# Patient Record
Sex: Male | Born: 1965 | Race: White | Hispanic: No | Marital: Married | Smoking: Former smoker
Health system: Southern US, Community
[De-identification: ages and names within clinical notes are randomized; demographics above are authoritative.]

## PROBLEM LIST (undated history)

## (undated) DIAGNOSIS — K219 Gastro-esophageal reflux disease without esophagitis: Secondary | ICD-10-CM

## (undated) HISTORY — PX: OTHER SURGICAL HISTORY: SHX169

## (undated) HISTORY — DX: Gastro-esophageal reflux disease without esophagitis: K21.9

---

## 1988-12-04 HISTORY — PX: HAND SURGERY: SHX662

## 2001-11-29 ENCOUNTER — Encounter: Payer: Self-pay | Admitting: Orthopedic Surgery

## 2001-11-29 ENCOUNTER — Ambulatory Visit (HOSPITAL_COMMUNITY): Admission: RE | Admit: 2001-11-29 | Discharge: 2001-11-29 | Payer: Self-pay | Admitting: Orthopedic Surgery

## 2008-05-02 ENCOUNTER — Ambulatory Visit (HOSPITAL_COMMUNITY): Admission: RE | Admit: 2008-05-02 | Discharge: 2008-05-02 | Payer: Self-pay | Admitting: Specialist

## 2008-05-29 ENCOUNTER — Ambulatory Visit (HOSPITAL_BASED_OUTPATIENT_CLINIC_OR_DEPARTMENT_OTHER): Admission: RE | Admit: 2008-05-29 | Discharge: 2008-05-29 | Payer: Self-pay | Admitting: Specialist

## 2008-06-10 ENCOUNTER — Encounter (HOSPITAL_COMMUNITY): Admission: RE | Admit: 2008-06-10 | Discharge: 2008-07-10 | Payer: Self-pay | Admitting: Specialist

## 2010-04-26 ENCOUNTER — Encounter: Payer: Self-pay | Admitting: Specialist

## 2010-07-21 LAB — POCT HEMOGLOBIN-HEMACUE: Hemoglobin: 12.3 g/dL — ABNORMAL LOW (ref 13.0–17.0)

## 2010-08-18 ENCOUNTER — Encounter: Payer: Self-pay | Admitting: Family Medicine

## 2010-08-18 NOTE — Op Note (Signed)
NAMEJAYEN, BROMWELL                ACCOUNT NO.:  [id number]   MEDICAL RECORD NO.:  [id number]          PATIENT TYPE:  AMB   LOCATION:  [facility]                         FACILITY:  [facility]   PHYSICIAN:  Erasmo Leventhal, M.D.DATE OF BIRTH:  1965-09-17   DATE OF PROCEDURE:  05/29/2008  DATE OF DISCHARGE:                               OPERATIVE REPORT   PREOPERATIVE DIAGNOSES:  1. Left shoulder possible labral tear.  2. Impingement syndrome.  3. Acromioclavicular arthritis.   POSTOPERATIVE DIAGNOSES:  1. Left shoulder degenerative labral tear.  2. Impingement syndrome.  3. Acromioclavicular arthritis.   PROCEDURES:  1. Left shoulder glenohumeral arthroscopy.  2. Intraarticular debridement of labral tear.  3. Arthroscopic subacromial decompression with acromioplasty,      bursectomy and CA ligament release.  4.  Arthroscopic distal      clavicle resection, Mumford procedure.   SURGEON:  Dr. Valma Cava.   ASSISTANT:  Leilani Able, PA-C.   ANESTHESIA:  Interscalene block, general.   ESTIMATED BLOOD LOSS:  Less than 10 mL.   DRAINS:  None.   COMPLICATIONS:  None.   DISPOSITION:  PACU stable.   OPERATIVE DETAILS:  The patient was counseled in the holding area.  The  correct side was identified.  IV started.  A block was administered.  IV  antibiotics were given on the way to the operating room.  In the  operating room, he was placed under general anesthesia and turned to a  right lateral decubitus position, properly padded and bumped.  The left  shoulder had been examined with full range of motion and stable.  He was  then prepped with DuraPrep and draped in a sterile fashion.  The  overhead shoulder positioner was utilized at 30 degrees of abduction, 10  degrees of forward flexion and 10 pounds of longitudinal traction.  A  posterior portal was created.  Arthroscope placed into the glenohumeral  joint.  Diagnostic arthroscopy revealed normal synovium, capsule,  rotator  cuff, articular cartilage, glenohumeral ligaments and biceps.  There was a degenerative labral tear superiorly and posteriorly without  destabilization of the biceps anchor.  Anterior portal was created from  the outside to in technique through the rotator cuff interval and this  was debrided back to healthy tissue and smoothed down nicely.  No other  abnormalities were noted.  The shoulder was stable, irrigated and the  arthroscope was placed in the subacromial region.  Subacromial region  with thick subacromial bursa.  A lateral portal was established.  Subacromial bursectomy was performed.  The rotator cuff on the bursal  surface was found to be intact.  He had a type 2 acromion.  The  articular system was utilized was used to release the periosteum from  the CA ligament.  A bur was then placed posteriorly and an anterior-  inferior acromioplasty was performed, converting it to a flat acromion  morphology, but making sure that excessive bone was not removed.  The [facility]  joint was found be extremely osteoarthritic with what looked like  osteolysis and necrosis of the distal clavicle.   The bur  was then placed and a lateral 5-8 mm of clavicle was removed  circumferentially, leaving the superior and posterior acromioclavicular  capsule intact.  The cuff was palpated and found to be stable.  We took  this back to healthy-appearing bone.  No other abnormalities were noted.  Irrigated, arthroscopic equipment was removed after meticulous  hemostasis.  Taken out of traction.  He had normal pulses in the wrist  at the end of the case.  The portals were closed 4-0 nylon suture and  after confirming anesthesia with nurse anesthetist, 20 mL of 0.25%  Marcaine with epinephrine was placed in portal sites and subacromial  region.  Sterile dressings applied.  Turned supine, awakened and taken  to from the operating room to the PACU in a stable condition.  No  complications or problems.  He will be  stabilized in the PACU and  discharged to home.   To help with surgical technique and decision making, Mr. Leilani Able PA-  C's assistance was needed.           ______________________________  Erasmo Leventhal, M.D.     RAC/MEDQ  D:  05/29/2008  T:  05/30/2008  Job:  [id number]

## 2011-03-18 ENCOUNTER — Ambulatory Visit (INDEPENDENT_AMBULATORY_CARE_PROVIDER_SITE_OTHER): Payer: Commercial Managed Care - PPO

## 2011-03-18 DIAGNOSIS — J189 Pneumonia, unspecified organism: Secondary | ICD-10-CM

## 2011-04-09 ENCOUNTER — Ambulatory Visit (HOSPITAL_COMMUNITY)
Admission: RE | Admit: 2011-04-09 | Discharge: 2011-04-09 | Disposition: A | Discharge status: Home / Self Care | Payer: 59 | Source: Ambulatory Visit | Attending: Gastroenterology | Admitting: Gastroenterology

## 2011-04-09 ENCOUNTER — Other Ambulatory Visit: Payer: Self-pay | Admitting: Gastroenterology

## 2011-04-09 ENCOUNTER — Encounter (HOSPITAL_COMMUNITY): Payer: Self-pay

## 2011-04-09 ENCOUNTER — Encounter (HOSPITAL_COMMUNITY)
Admission: RE | Disposition: A | Discharge status: Home / Self Care | Payer: Self-pay | Source: Ambulatory Visit | Attending: Gastroenterology

## 2011-04-09 DIAGNOSIS — D126 Benign neoplasm of colon, unspecified: Secondary | ICD-10-CM | POA: Insufficient documentation

## 2011-04-09 DIAGNOSIS — K648 Other hemorrhoids: Secondary | ICD-10-CM | POA: Insufficient documentation

## 2011-04-09 DIAGNOSIS — K644 Residual hemorrhoidal skin tags: Secondary | ICD-10-CM | POA: Insufficient documentation

## 2011-04-09 DIAGNOSIS — D509 Iron deficiency anemia, unspecified: Secondary | ICD-10-CM | POA: Insufficient documentation

## 2011-04-09 DIAGNOSIS — D131 Benign neoplasm of stomach: Secondary | ICD-10-CM | POA: Insufficient documentation

## 2011-04-09 DIAGNOSIS — K2970 Gastritis, unspecified, without bleeding: Secondary | ICD-10-CM | POA: Insufficient documentation

## 2011-04-09 HISTORY — PX: COLONOSCOPY: SHX5424

## 2011-04-09 HISTORY — PX: ESOPHAGOGASTRODUODENOSCOPY: SHX5428

## 2011-04-09 SURGERY — EGD (ESOPHAGOGASTRODUODENOSCOPY)
Anesthesia: Moderate Sedation

## 2011-04-09 MED ORDER — BUTAMBEN-TETRACAINE-BENZOCAINE 2-2-14 % EX AERO
INHALATION_SPRAY | CUTANEOUS | Status: DC | PRN
  Administered 2011-04-09: 2 via TOPICAL

## 2011-04-09 MED ORDER — MIDAZOLAM HCL 10 MG/2ML IJ SOLN
INTRAMUSCULAR | Status: AC
  Filled 2011-04-09: qty 4

## 2011-04-09 MED ORDER — MIDAZOLAM HCL 10 MG/2ML IJ SOLN
INTRAMUSCULAR | Status: DC | PRN
  Administered 2011-04-09: 2 mg via INTRAVENOUS
  Administered 2011-04-09 (×2): 1 mg via INTRAVENOUS
  Administered 2011-04-09 (×2): 2 mg via INTRAVENOUS

## 2011-04-09 MED ORDER — FENTANYL CITRATE 0.05 MG/ML IJ SOLN
INTRAMUSCULAR | Status: AC
  Filled 2011-04-09: qty 4

## 2011-04-09 MED ORDER — DIPHENHYDRAMINE HCL 50 MG/ML IJ SOLN
INTRAMUSCULAR | Status: AC
  Filled 2011-04-09: qty 1

## 2011-04-09 MED ORDER — FENTANYL NICU IV SYRINGE 50 MCG/ML
INJECTION | INTRAMUSCULAR | Status: DC | PRN
  Administered 2011-04-09 (×4): 25 ug via INTRAVENOUS

## 2011-04-09 MED ORDER — SODIUM CHLORIDE 0.9 % IV SOLN
Freq: Once | INTRAVENOUS | Status: AC
  Administered 2011-04-09: 500 mL via INTRAVENOUS

## 2011-04-09 NOTE — H&P (Signed)
  The patient is here for an EGD/colonoscopy as he was noted to have an iron deficiency anemia.  He does have a chronic history of NSAIDs, but he stopped recently.  Currently he is on a PPI.  Further evaluation will be performed for further evaluation.

## 2011-04-09 NOTE — Interval H&P Note (Signed)
History and Physical Interval Note:  04/09/2011 11:09 AM  Nathaniel Mahoney  has presented today for surgery, with the diagnosis of anemia  The various methods of treatment have been discussed with the patient and family. After consideration of risks, benefits and other options for treatment, the patient has consented to  Procedure(s): ESOPHAGOGASTRODUODENOSCOPY (EGD) COLONOSCOPY as a surgical intervention .  The patients' history has been reviewed, patient examined, no change in status, stable for surgery.  I have reviewed the patients' chart and labs.  Questions were answered to the patient's satisfaction.     Hector Taft D

## 2011-04-09 NOTE — Op Note (Signed)
[facility] [address] [place], [place]  [address]  OPERATIVE PROCEDURE REPORT  PATIENT:  Nathaniel Mahoney, Nathaniel Mahoney  MR#:  [id number] BIRTHDATE:  08-Apr-1965  GENDER:  male ENDOSCOPIST:  Jeani Hawking, MD PROCEDURE DATE:  04/09/2011 PROCEDURE:  EGD with biopsy and snare polypectomy ASA CLASS:  Class I INDICATIONS:  Iron Deficiency Anenmia MEDICATIONS:  Fentanyl 50 mcg IV, Versed 5 mg IV  DESCRIPTION OF PROCEDURE:   After the risks benefits and alternatives of the procedure were thoroughly explained, informed consent was obtained.  The EG-[id number] ([id number]) endoscope was introduced through the mouth and advanced to the second portion of the duodenum, without limitations.  The instrument was slowly withdrawn as the mucosa was fully examined. <<PROCEDUREIMAGES>>  FINDINGS:  A few gastric pooyops were identified and removed with a cold snare. In the mid body of the stomach there was evidence of a gastritis. ? prior ulceration or erosion in this area. Cold biopsies were obtained in this area. No other abnormalities noted. Retroflexed views revealed no abnormalities.    The scope was then withdrawn from the patient and the procedure terminated.  COMPLICATIONS:  None  IMPRESSION:  1) Gastric polyps 2) Gastritis RECOMMENDATIONS:  1) Await biopsy results 2) Proceed with the colonoscopy. 3) Avoid NSAIDs and continue with PPI.  ______________________________ Jeani Hawking, MD  n. Rosalie DoctorJeani Hawking at 04/09/2011 11:27 AM  Leamon Arnt, [phone]

## 2011-04-09 NOTE — Op Note (Signed)
[facility] [address] [place], [place]  [address]  OPERATIVE PROCEDURE REPORT  PATIENT:  Nathaniel Mahoney, Nathaniel Mahoney  MR#:  [id number] BIRTHDATE:  Dec 08, 1965  GENDER:  male ENDOSCOPIST:  Jeani Hawking, MD PROCEDURE DATE:  04/09/2011 PROCEDURE:  Colonoscopy with snare polypectomy ASA CLASS:  Class I INDICATIONS:  Iron Deficiency Anemia MEDICATIONS:  Fentanyl 25 mcg IV, Versed 2 mg IV  DESCRIPTION OF PROCEDURE:   After the risks benefits and alternatives of the procedure were thoroughly explained, informed consent was obtained.  Digital rectal exam was performed and revealed no abnormalities.   The  endoscope was introduced through the anus and advanced to the terminal ileum which was intubated for a short distance, without limitations.  The quality of the prep was excellent..  The instrument was then slowly withdrawn as the colon was fully examined. <<PROCEDUREIMAGES>>  FINDINGS:  A 3 mm sessile descending colon polyp was removed with a cold snare. No other abnormalities were identified. Retroflexed views in the rectum revealed internal and external hemorrhoids.    The scope was then withdrawn from the patient and the procedure terminated.  COMPLICATIONS:  None  IMPRESSION:  1) Sessile polyp 2) Internal and external hemorrhoids RECOMMENDATIONS:  1) Await biopsy results. 2) Repeat colonoscopy in 5-10 years. 3) Recheck iron panel and CBC.  Continue with iron supplementation and avoid NSAIDs.  If his HGB levels normalize no further work up will be required, i.e., Capsule Endoscopy.  ______________________________ Jeani Hawking, MD  n. Rosalie DoctorJeani Hawking at 04/09/2011 11:47 AM  Leamon Arnt, [phone]

## 2011-04-12 ENCOUNTER — Encounter (HOSPITAL_COMMUNITY): Payer: Self-pay

## 2011-04-12 ENCOUNTER — Encounter (HOSPITAL_COMMUNITY): Payer: Self-pay | Admitting: Gastroenterology

## 2014-07-19 ENCOUNTER — Encounter (HOSPITAL_COMMUNITY): Payer: Self-pay | Admitting: *Deleted

## 2014-07-19 ENCOUNTER — Emergency Department (HOSPITAL_COMMUNITY)
Admission: EM | Admit: 2014-07-19 | Discharge: 2014-07-19 | Disposition: A | Discharge status: Home / Self Care | Payer: PRIVATE HEALTH INSURANCE | Attending: Emergency Medicine | Admitting: Emergency Medicine

## 2014-07-19 DIAGNOSIS — J45909 Unspecified asthma, uncomplicated: Secondary | ICD-10-CM | POA: Insufficient documentation

## 2014-07-19 DIAGNOSIS — Z79899 Other long term (current) drug therapy: Secondary | ICD-10-CM | POA: Diagnosis not present

## 2014-07-19 DIAGNOSIS — S61219A Laceration without foreign body of unspecified finger without damage to nail, initial encounter: Secondary | ICD-10-CM

## 2014-07-19 DIAGNOSIS — Y9389 Activity, other specified: Secondary | ICD-10-CM | POA: Insufficient documentation

## 2014-07-19 DIAGNOSIS — Y9289 Other specified places as the place of occurrence of the external cause: Secondary | ICD-10-CM | POA: Insufficient documentation

## 2014-07-19 DIAGNOSIS — Z23 Encounter for immunization: Secondary | ICD-10-CM | POA: Insufficient documentation

## 2014-07-19 DIAGNOSIS — Z7951 Long term (current) use of inhaled steroids: Secondary | ICD-10-CM | POA: Insufficient documentation

## 2014-07-19 DIAGNOSIS — Y990 Civilian activity done for income or pay: Secondary | ICD-10-CM | POA: Diagnosis not present

## 2014-07-19 DIAGNOSIS — Z87891 Personal history of nicotine dependence: Secondary | ICD-10-CM | POA: Insufficient documentation

## 2014-07-19 DIAGNOSIS — S61215A Laceration without foreign body of left ring finger without damage to nail, initial encounter: Secondary | ICD-10-CM | POA: Insufficient documentation

## 2014-07-19 DIAGNOSIS — K219 Gastro-esophageal reflux disease without esophagitis: Secondary | ICD-10-CM | POA: Insufficient documentation

## 2014-07-19 DIAGNOSIS — Y288XXA Contact with other sharp object, undetermined intent, initial encounter: Secondary | ICD-10-CM | POA: Diagnosis not present

## 2014-07-19 MED ORDER — LIDOCAINE HCL (PF) 1 % IJ SOLN
5.0000 mL | Freq: Once | INTRAMUSCULAR | Status: DC
  Filled 2014-07-19: qty 5

## 2014-07-19 MED ORDER — TETANUS-DIPHTH-ACELL PERTUSSIS 5-2.5-18.5 LF-MCG/0.5 IM SUSP
0.5000 mL | Freq: Once | INTRAMUSCULAR | Status: AC
  Administered 2014-07-19: 0.5 mL via INTRAMUSCULAR
  Filled 2014-07-19: qty 0.5

## 2014-07-19 MED ORDER — POVIDONE-IODINE 10 % EX SOLN
CUTANEOUS | Status: AC
  Filled 2014-07-19: qty 118

## 2014-07-19 NOTE — ED Notes (Signed)
Pt at work, door closed on lt hand, 3rd digit left hand middle knuckle has an open bleeding wound.

## 2014-07-19 NOTE — ED Provider Notes (Signed)
CSN: [id number]     Arrival date & time 07/19/14  1227 History   First MD Initiated Contact with Patient 07/19/14 1255     Chief Complaint  Patient presents with  . Laceration    3rd digit left hand     (Consider location/radiation/quality/duration/timing/severity/associated sxs/prior Treatment) HPI   Nathaniel Mahoney is a 49 y.o. male who presents to the Emergency Department complaining of laceration to the left third finger.  Patient is employed here in the maintenance dept and cut his finger while trying to hold a door and it closed against his finger.  Reports minimal bleeding.  Has applied pressure.  Last Td is unknown.  He denies numbness or weakness of the finger, swelling or possible foreign body   Past Medical History  Diagnosis Date  . GERD (gastroesophageal reflux disease)   . Allergic rhinitis   . Asthma     exercise induced   Past Surgical History  Procedure Laterality Date  . Hand surgery  1990's     for boxer's fracture   . L shoulder surgery    . Rt wrist surgery    . Esophagogastroduodenoscopy  04/09/2011    Procedure: ESOPHAGOGASTRODUODENOSCOPY (EGD);  Surgeon: Beryle Beams, MD;  Location: [facility];  Service: Endoscopy;  Laterality: N/A;  . Colonoscopy  04/09/2011    Procedure: COLONOSCOPY;  Surgeon: Beryle Beams, MD;  Location: [facility];  Service: Endoscopy;  Laterality: N/A;   History reviewed. No pertinent family history. History  Substance Use Topics  . Smoking status: Former Research scientist (life sciences)  . Smokeless tobacco: Not on file  . Alcohol Use: No    Review of Systems  Constitutional: Negative for fever and chills.  Musculoskeletal: Negative for back pain, joint swelling and arthralgias.  Skin: Positive for wound.       Laceration   Neurological: Negative for dizziness, weakness and numbness.  Hematological: Does not bruise/bleed easily.  All other systems reviewed and are negative.     Allergies  Crestor  Home Medications   Prior to  Admission medications   Medication Sig Start Date End Date Taking? Authorizing Provider  ALBUTEROL IN Inhale into the lungs as needed.      Historical Provider, MD  esomeprazole (NEXIUM) 40 MG capsule Take 40 mg by mouth daily before breakfast.      Historical Provider, MD  ferrous sulfate 325 (65 FE) MG tablet Take 325 mg by mouth daily with breakfast.      Historical Provider, MD  Fexofenadine HCl (ALLEGRA PO) Take by mouth as needed.      Historical Provider, MD  mometasone (NASONEX) 50 MCG/ACT nasal spray 2 sprays by Nasal route as needed.      Historical Provider, MD  Montelukast Sodium (SINGULAIR PO) Take by mouth as needed.      Historical Provider, MD  simvastatin (ZOCOR) 40 MG tablet Take 40 mg by mouth every evening.      Historical Provider, MD   BP 133/86 mmHg  Pulse 74  Temp(Src) 98.8 F (37.1 C) (Oral)  Resp 18  Ht 5\' 10"  (1.778 m)  Wt 183 lb (83.008 kg)  BMI 26.26 kg/m2  SpO2 100% Physical Exam  Constitutional: He is oriented to person, place, and time. He appears well-developed and well-nourished. No distress.  HENT:  Head: Normocephalic and atraumatic.  Cardiovascular: Normal rate, regular rhythm, normal heart sounds and intact distal pulses.   No murmur heard. Pulmonary/Chest: Effort normal and breath sounds normal. No respiratory distress.  Musculoskeletal:  He exhibits no edema or tenderness.  Pt has full ROM of the left third finger  Distal sensation intact  Neurological: He is alert and oriented to person, place, and time. He exhibits normal muscle tone. Coordination normal.  Skin: Skin is warm. Laceration noted.  Laceration to the dorsal surface of the left third finger.  No FB.s  Bleeding controlled  Nursing note and vitals reviewed.   ED Course  Procedures (including critical care time) Labs Review Labs Reviewed - No data to display  Imaging Review No results found.   EKG Interpretation None       LACERATION REPAIR Performed by: Stevana Dufner  L. Authorized by: Hale Bogus Consent: Verbal consent obtained. Risks and benefits: risks, benefits and alternatives were discussed Consent given by: patient Patient identity confirmed: provided demographic data Prepped and Draped in normal sterile fashion Wound explored  Laceration Location: left ring finger Laceration Length: 2 cm  No Foreign Bodies seen or palpated  Anesthesia: local infiltration  Local anesthetic: lidocaine 1% w/o epinephrine  Anesthetic total: 2 ml  Irrigation method: syringe Amount of cleaning: standard  Skin closure: 4-0 prolene  Number of sutures: 3  Technique: simple interrupted  Patient tolerance: Patient tolerated the procedure well with no immediate complications.  MDM   Final diagnoses:  Finger laceration, initial encounter    Small lac over the PIP joint of the left ring finger.  Bleeding controlled.  NV intact.  No obvious tendon, bone or nerve injury.  Pt agrees to return for any signs of infection.  Sutures out in 10 days    Bufford Lope 07/21/14 2131  Milton Ferguson, MD 07/22/14 [phone]

## 2014-07-19 NOTE — Discharge Instructions (Signed)

## 2014-07-29 ENCOUNTER — Emergency Department (HOSPITAL_COMMUNITY)
Admission: EM | Admit: 2014-07-29 | Discharge: 2014-07-29 | Disposition: A | Discharge status: Home / Self Care | Payer: PRIVATE HEALTH INSURANCE | Attending: Emergency Medicine | Admitting: Emergency Medicine

## 2014-07-29 ENCOUNTER — Encounter (HOSPITAL_COMMUNITY): Payer: Self-pay

## 2014-07-29 DIAGNOSIS — Z87891 Personal history of nicotine dependence: Secondary | ICD-10-CM | POA: Insufficient documentation

## 2014-07-29 DIAGNOSIS — Z4802 Encounter for removal of sutures: Secondary | ICD-10-CM | POA: Diagnosis present

## 2014-07-29 DIAGNOSIS — K219 Gastro-esophageal reflux disease without esophagitis: Secondary | ICD-10-CM | POA: Insufficient documentation

## 2014-07-29 DIAGNOSIS — Z7951 Long term (current) use of inhaled steroids: Secondary | ICD-10-CM | POA: Diagnosis not present

## 2014-07-29 DIAGNOSIS — J45909 Unspecified asthma, uncomplicated: Secondary | ICD-10-CM | POA: Diagnosis not present

## 2014-07-29 DIAGNOSIS — Z79899 Other long term (current) drug therapy: Secondary | ICD-10-CM | POA: Diagnosis not present

## 2014-07-29 NOTE — ED Notes (Signed)
Pt here for suture removal from left ring finger.  Reports were put in 10 days ago.  Denies any problems.

## 2014-07-29 NOTE — Discharge Instructions (Signed)

## 2014-07-29 NOTE — ED Provider Notes (Signed)
CSN: [id number]     Arrival date & time 07/29/14  1010 History   First MD Initiated Contact with Patient 07/29/14 1021     Chief Complaint  Patient presents with  . Suture / Staple Removal     (Consider location/radiation/quality/duration/timing/severity/associated sxs/prior Treatment) Patient is a 49 y.o. male presenting with suture removal.  Suture / Staple Removal This is a new problem. The current episode started 1 to 4 weeks ago. The problem has been rapidly improving. Pertinent negatives include no abdominal pain, arthralgias, chest pain, coughing, fever, neck pain or numbness. Nothing aggravates the symptoms. Treatments tried: Sutured laceration. The treatment provided significant relief.    Past Medical History  Diagnosis Date  . GERD (gastroesophageal reflux disease)   . Allergic rhinitis   . Asthma     exercise induced   Past Surgical History  Procedure Laterality Date  . Hand surgery  1990's     for boxer's fracture   . L shoulder surgery    . Rt wrist surgery    . Esophagogastroduodenoscopy  04/09/2011    Procedure: ESOPHAGOGASTRODUODENOSCOPY (EGD);  Surgeon: Beryle Beams, MD;  Location: [facility];  Service: Endoscopy;  Laterality: N/A;  . Colonoscopy  04/09/2011    Procedure: COLONOSCOPY;  Surgeon: Beryle Beams, MD;  Location: [facility];  Service: Endoscopy;  Laterality: N/A;   No family history on file. History  Substance Use Topics  . Smoking status: Former Research scientist (life sciences)  . Smokeless tobacco: Not on file  . Alcohol Use: No    Review of Systems  Constitutional: Negative for fever and activity change.       All ROS Neg except as noted in HPI  HENT: Negative for nosebleeds.   Eyes: Negative for photophobia and discharge.  Respiratory: Negative for cough, shortness of breath and wheezing.   Cardiovascular: Negative for chest pain and palpitations.  Gastrointestinal: Negative for abdominal pain and blood in stool.  Genitourinary: Negative for dysuria,  frequency and hematuria.  Musculoskeletal: Negative for back pain, arthralgias and neck pain.  Skin: Negative.   Neurological: Negative for dizziness, seizures, speech difficulty and numbness.  Psychiatric/Behavioral: Negative for hallucinations and confusion.      Allergies  Crestor  Home Medications   Prior to Admission medications   Medication Sig Start Date End Date Taking? Authorizing Provider  ALBUTEROL IN Inhale into the lungs as needed.      Historical Provider, MD  esomeprazole (NEXIUM) 40 MG capsule Take 40 mg by mouth daily before breakfast.      Historical Provider, MD  ferrous sulfate 325 (65 FE) MG tablet Take 325 mg by mouth daily with breakfast.      Historical Provider, MD  Fexofenadine HCl (ALLEGRA PO) Take by mouth as needed.      Historical Provider, MD  mometasone (NASONEX) 50 MCG/ACT nasal spray 2 sprays by Nasal route as needed.      Historical Provider, MD  Montelukast Sodium (SINGULAIR PO) Take by mouth as needed.      Historical Provider, MD  simvastatin (ZOCOR) 40 MG tablet Take 40 mg by mouth every evening.      Historical Provider, MD   BP 124/70 mmHg  Pulse 68  Temp(Src) 97.9 F (36.6 C) (Oral)  Resp 18  Ht 5\' 10"  (1.778 m)  Wt 185 lb (83.915 kg)  BMI 26.54 kg/m2  SpO2 100% Physical Exam  Constitutional: He is oriented to person, place, and time. He appears well-developed and well-nourished.  Non-toxic appearance.  HENT:  Head: Normocephalic.  Right Ear: Tympanic membrane and external ear normal.  Left Ear: Tympanic membrane and external ear normal.  Eyes: EOM and lids are normal. Pupils are equal, round, and reactive to light.  Neck: Normal range of motion. Neck supple. Carotid bruit is not present.  Cardiovascular: Normal rate, regular rhythm, normal heart sounds, intact distal pulses and normal pulses.   Pulmonary/Chest: Breath sounds normal. No respiratory distress.  Abdominal: Soft. Bowel sounds are normal. There is no tenderness. There is  no guarding.  Musculoskeletal: Normal range of motion.  The sutured laceration to the left ring finger is healing nicely. Sutures remain intact. There is no evidence of any infection, and the patient has good range of motion of the PIP joint.  Lymphadenopathy:       Head (right side): No submandibular adenopathy present.       Head (left side): No submandibular adenopathy present.    He has no cervical adenopathy.  Neurological: He is alert and oriented to person, place, and time. He has normal strength. No cranial nerve deficit or sensory deficit.  Skin: Skin is warm and dry.  Psychiatric: He has a normal mood and affect. His speech is normal.  Nursing note and vitals reviewed.   ED Course  Procedures (including critical care time) Labs Review Labs Reviewed - No data to display  Imaging Review No results found.   EKG Interpretation None      MDM  Vital signs are nonacute. There is no evidence of infection related to the sutured laceration of the left ring finger. Sutures removed by nursing. Recheck reveals well-healed laceration to the ring finger.    Final diagnoses:  None    **I have reviewed nursing notes, vital signs, and all appropriate lab and imaging results for this patient.Lily Kocher, PA-C 07/29/14 1742  Orpah Greek, MD 07/31/14 [phone]

## 2015-03-14 ENCOUNTER — Telehealth: Payer: 59 | Admitting: Nurse Practitioner

## 2015-03-14 DIAGNOSIS — R05 Cough: Secondary | ICD-10-CM | POA: Diagnosis not present

## 2015-03-14 DIAGNOSIS — R059 Cough, unspecified: Secondary | ICD-10-CM

## 2015-03-14 MED ORDER — AZITHROMYCIN 250 MG PO TABS
ORAL_TABLET | ORAL | Status: DC
Start: 2015-03-14 — End: 2015-05-15

## 2015-03-14 MED ORDER — BENZONATATE 100 MG PO CAPS: 100.0000 mg | ORAL_CAPSULE | Freq: Three times a day (TID) | ORAL | Status: DC | PRN

## 2015-03-14 NOTE — Progress Notes (Signed)

## 2015-03-24 ENCOUNTER — Telehealth: Payer: 59 | Admitting: Family

## 2015-03-24 DIAGNOSIS — J069 Acute upper respiratory infection, unspecified: Secondary | ICD-10-CM

## 2015-03-24 MED ORDER — DOXYCYCLINE HYCLATE 100 MG PO TABS: 100.0000 mg | ORAL_TABLET | Freq: Two times a day (BID) | ORAL | Status: DC

## 2015-03-24 MED ORDER — BENZONATATE 100 MG PO CAPS
100.0000 mg | ORAL_CAPSULE | Freq: Three times a day (TID) | ORAL | Status: DC | PRN
Start: 2015-03-24 — End: 2015-05-15

## 2015-03-24 NOTE — Progress Notes (Signed)
We are sorry that you are not feeling well.  Here is how we plan to help!  Based on what you have shared with me it looks like you have upper respiratory tract inflammation that has resulted in a significant cough.  Inflammation and infection in the upper respiratory tract is commonly called bronchitis and has four common causes:  Allergies, Viral Infections, Acid Reflux and Bacterial Infections.  Allergies, viruses and acid reflux are treated by controlling symptoms or eliminating the cause. An example might be a cough caused by taking certain blood pressure medications. You stop the cough by changing the medication. Another example might be a cough caused by acid reflux. Controlling the reflux helps control the cough.  Based on your presentation I believe you most likely have A cough due to bacteria.  When patients have a fever and a productive cough with a change in color or increased sputum production, we are concerned about bacterial bronchitis.  If left untreated it can progress to pneumonia.  If your symptoms do not improve with your treatment plan it is important that you contact your provider.   I hve prescribed Doxycycline 100 mg twice a day for 7 days    In addition you may use A non-prescription cough medication called Robitussin DAC. Take 2 teaspoons every 8 hours or Delsym: take 2 teaspoons every 12 hours., A non-prescription cough medication called Mucinex DM: take 2 tablets every 12 hours. and A prescription cough medication called Tessalon Perles 100mg . You may take 1-2 capsules every 8 hours as needed for your cough.    HOME CARE . Only take medications as instructed by your medical team. . Complete the entire course of an antibiotic. . Drink plenty of fluids and get plenty of rest. . Avoid close contacts especially the very young and the elderly . Cover your mouth if you cough or cough into your sleeve. . Always remember to wash your hands . A steam or ultrasonic humidifier can help  congestion.    GET HELP RIGHT AWAY IF: . You develop worsening fever. . You become short of breath . You cough up blood. . Your symptoms persist after you have completed your treatment plan MAKE SURE YOU   Understand these instructions.  Will watch your condition.  Will get help right away if you are not doing well or get worse.  Your e-visit answers were reviewed by a board certified advanced clinical practitioner to complete your personal care plan.  Depending on the condition, your plan could have included both over the counter or prescription medications. If there is a problem please reply  once you have received a response from your provider. Your safety is important to [place].  If you have drug allergies check your prescription carefully.    You can use MyChart to ask questions about today's visit, request a non-urgent call back, or ask for a work or school excuse for 24 hours related to this e-Visit. If it has been greater than 24 hours you will need to follow up with your provider, or enter a new e-Visit to address those concerns. You will get an e-mail in the next two days asking about your experience.  I hope that your e-visit has been valuable and will speed your recovery. Thank you for using e-visits.

## 2015-04-24 MED FILL — EZETIMIBE 10 MG TABLET: 10 | 30 days supply | Qty: 30 | Fill #0

## 2015-04-24 MED FILL — SIMVASTATIN 40 MG TABLET: 40 | 30 days supply | Qty: 30 | Fill #0

## 2015-05-09 MED FILL — ESOMEPRAZOLE MAG DR 40 MG C: 40 | 90 days supply | Qty: 90 | Fill #0

## 2015-05-12 ENCOUNTER — Other Ambulatory Visit (HOSPITAL_COMMUNITY): Payer: Self-pay | Admitting: Otolaryngology

## 2015-05-15 ENCOUNTER — Encounter (HOSPITAL_BASED_OUTPATIENT_CLINIC_OR_DEPARTMENT_OTHER): Payer: Self-pay | Admitting: *Deleted

## 2015-05-23 ENCOUNTER — Encounter (HOSPITAL_BASED_OUTPATIENT_CLINIC_OR_DEPARTMENT_OTHER)
Admission: RE | Disposition: A | Discharge status: Home / Self Care | Payer: Self-pay | Source: Ambulatory Visit | Attending: Otolaryngology

## 2015-05-23 ENCOUNTER — Ambulatory Visit (HOSPITAL_BASED_OUTPATIENT_CLINIC_OR_DEPARTMENT_OTHER): Payer: 59 | Admitting: Anesthesiology

## 2015-05-23 ENCOUNTER — Encounter (HOSPITAL_BASED_OUTPATIENT_CLINIC_OR_DEPARTMENT_OTHER): Payer: Self-pay | Admitting: Anesthesiology

## 2015-05-23 ENCOUNTER — Ambulatory Visit (HOSPITAL_BASED_OUTPATIENT_CLINIC_OR_DEPARTMENT_OTHER)
Admission: RE | Admit: 2015-05-23 | Discharge: 2015-05-23 | Disposition: A | Discharge status: Home / Self Care | Payer: 59 | Source: Ambulatory Visit | Attending: Otolaryngology | Admitting: Otolaryngology

## 2015-05-23 DIAGNOSIS — J342 Deviated nasal septum: Secondary | ICD-10-CM | POA: Insufficient documentation

## 2015-05-23 DIAGNOSIS — Z79899 Other long term (current) drug therapy: Secondary | ICD-10-CM | POA: Insufficient documentation

## 2015-05-23 DIAGNOSIS — K219 Gastro-esophageal reflux disease without esophagitis: Secondary | ICD-10-CM | POA: Diagnosis not present

## 2015-05-23 DIAGNOSIS — Z87891 Personal history of nicotine dependence: Secondary | ICD-10-CM | POA: Insufficient documentation

## 2015-05-23 DIAGNOSIS — J343 Hypertrophy of nasal turbinates: Secondary | ICD-10-CM | POA: Insufficient documentation

## 2015-05-23 DIAGNOSIS — J45909 Unspecified asthma, uncomplicated: Secondary | ICD-10-CM | POA: Diagnosis not present

## 2015-05-23 HISTORY — PX: NASAL SEPTOPLASTY W/ TURBINOPLASTY: SHX2070

## 2015-05-23 SURGERY — SEPTOPLASTY, NOSE, WITH NASAL TURBINATE REDUCTION
Anesthesia: General | Site: Nose

## 2015-05-23 MED ORDER — FENTANYL CITRATE (PF) 100 MCG/2ML IJ SOLN
INTRAMUSCULAR | Status: AC
  Filled 2015-05-23: qty 2

## 2015-05-23 MED ORDER — OXYMETAZOLINE HCL 0.05 % NA SOLN
NASAL | Status: DC | PRN
  Administered 2015-05-23: 1

## 2015-05-23 MED ORDER — FENTANYL CITRATE (PF) 100 MCG/2ML IJ SOLN
INTRAMUSCULAR | Status: DC | PRN
  Administered 2015-05-23: 100 ug via INTRAVENOUS
  Administered 2015-05-23: 25 ug via INTRAVENOUS

## 2015-05-23 MED ORDER — SUCCINYLCHOLINE CHLORIDE 20 MG/ML IJ SOLN
INTRAMUSCULAR | Status: AC
  Filled 2015-05-23: qty 1

## 2015-05-23 MED ORDER — MIDAZOLAM HCL 2 MG/2ML IJ SOLN
INTRAMUSCULAR | Status: AC
  Filled 2015-05-23: qty 2

## 2015-05-23 MED ORDER — GLYCOPYRROLATE 0.2 MG/ML IJ SOLN: 0.2000 mg | Freq: Once | INTRAMUSCULAR | Status: DC | PRN

## 2015-05-23 MED ORDER — BACITRACIN ZINC 500 UNIT/GM EX OINT
TOPICAL_OINTMENT | CUTANEOUS | Status: AC
  Filled 2015-05-23: qty 28.35

## 2015-05-23 MED ORDER — MUPIROCIN 2 % EX OINT
TOPICAL_OINTMENT | CUTANEOUS | Status: AC
  Filled 2015-05-23: qty 22

## 2015-05-23 MED ORDER — PROPOFOL 10 MG/ML IV BOLUS
INTRAVENOUS | Status: DC | PRN
Start: 2015-05-23 — End: 2015-05-23
  Administered 2015-05-23: 200 mg via INTRAVENOUS

## 2015-05-23 MED ORDER — LIDOCAINE HCL (CARDIAC) 20 MG/ML IV SOLN
INTRAVENOUS | Status: AC
  Filled 2015-05-23: qty 5

## 2015-05-23 MED ORDER — LIDOCAINE-EPINEPHRINE 1 %-1:100000 IJ SOLN
INTRAMUSCULAR | Status: AC
  Filled 2015-05-23: qty 1

## 2015-05-23 MED ORDER — ONDANSETRON HCL 4 MG/2ML IJ SOLN: 4.0000 mg | Freq: Once | INTRAMUSCULAR | Status: DC | PRN

## 2015-05-23 MED ORDER — LIDOCAINE HCL (CARDIAC) 20 MG/ML IV SOLN
INTRAVENOUS | Status: DC | PRN
  Administered 2015-05-23: 50 mg via INTRAVENOUS

## 2015-05-23 MED ORDER — DEXAMETHASONE SODIUM PHOSPHATE 10 MG/ML IJ SOLN
INTRAMUSCULAR | Status: AC
  Filled 2015-05-23: qty 1

## 2015-05-23 MED ORDER — MIDAZOLAM HCL 5 MG/5ML IJ SOLN
INTRAMUSCULAR | Status: DC | PRN
  Administered 2015-05-23: 2 mg via INTRAVENOUS

## 2015-05-23 MED ORDER — OXYMETAZOLINE HCL 0.05 % NA SOLN
NASAL | Status: AC
  Filled 2015-05-23: qty 15

## 2015-05-23 MED ORDER — ONDANSETRON HCL 4 MG/2ML IJ SOLN
INTRAMUSCULAR | Status: AC
  Filled 2015-05-23: qty 2

## 2015-05-23 MED ORDER — DEXAMETHASONE SODIUM PHOSPHATE 4 MG/ML IJ SOLN
INTRAMUSCULAR | Status: DC | PRN
  Administered 2015-05-23: 10 mg via INTRAVENOUS

## 2015-05-23 MED ORDER — HYDROCODONE-ACETAMINOPHEN 5-325 MG PO TABS: 1.0000 | ORAL_TABLET | Freq: Four times a day (QID) | ORAL | Status: DC | PRN

## 2015-05-23 MED ORDER — SUCCINYLCHOLINE CHLORIDE 20 MG/ML IJ SOLN
INTRAMUSCULAR | Status: DC | PRN
  Administered 2015-05-23: 50 mg via INTRAVENOUS

## 2015-05-23 MED ORDER — OXYCODONE HCL 5 MG PO TABS: 5.0000 mg | ORAL_TABLET | Freq: Once | ORAL | Status: DC | PRN

## 2015-05-23 MED ORDER — PROPOFOL 10 MG/ML IV BOLUS
INTRAVENOUS | Status: AC
  Filled 2015-05-23: qty 40

## 2015-05-23 MED ORDER — FENTANYL CITRATE (PF) 100 MCG/2ML IJ SOLN: 50.0000 ug | INTRAMUSCULAR | Status: DC | PRN

## 2015-05-23 MED ORDER — SCOPOLAMINE 1 MG/3DAYS TD PT72: 1.0000 | MEDICATED_PATCH | Freq: Once | TRANSDERMAL | Status: DC | PRN

## 2015-05-23 MED ORDER — LACTATED RINGERS IV SOLN
INTRAVENOUS | Status: DC
  Administered 2015-05-23: 10 mL/h via INTRAVENOUS
  Administered 2015-05-23 (×2): via INTRAVENOUS

## 2015-05-23 MED ORDER — MIDAZOLAM HCL 2 MG/2ML IJ SOLN: 1.0000 mg | INTRAMUSCULAR | Status: DC | PRN

## 2015-05-23 MED ORDER — CEPHALEXIN 500 MG PO CAPS: 500.0000 mg | ORAL_CAPSULE | Freq: Three times a day (TID) | ORAL | Status: DC

## 2015-05-23 MED ORDER — OXYCODONE HCL 5 MG/5ML PO SOLN: 5.0000 mg | Freq: Once | ORAL | Status: DC | PRN

## 2015-05-23 MED ORDER — FENTANYL CITRATE (PF) 100 MCG/2ML IJ SOLN: 25.0000 ug | INTRAMUSCULAR | Status: DC | PRN

## 2015-05-23 MED ORDER — ONDANSETRON HCL 4 MG/2ML IJ SOLN
INTRAMUSCULAR | Status: DC | PRN
Start: 2015-05-23 — End: 2015-05-23
  Administered 2015-05-23: 4 mg via INTRAVENOUS

## 2015-05-23 MED ORDER — LIDOCAINE-EPINEPHRINE 1 %-1:100000 IJ SOLN
INTRAMUSCULAR | Status: DC | PRN
  Administered 2015-05-23: 13 mL

## 2015-05-23 MED FILL — CEPHALEXIN 500 MG CAPSULE: 500 | 5 days supply | Qty: 15 | Fill #0

## 2015-05-23 MED FILL — HYDROCODON-APAP 5-325: 5-325 | 4 days supply | Qty: 30 | Fill #0

## 2015-05-23 SURGICAL SUPPLY — 39 items
ATTRACTOMAT 16X20 MAGNETIC DRP (DRAPES) IMPLANT
BLADE INF TURB ROT M4 2 5PK (BLADE) ×2 IMPLANT
BLADE INF TURB ROT M4 2MM 5PK (BLADE) ×1
BLADE TRICUT ROTATE M4 4 5PK (BLADE) IMPLANT
BLADE TRICUT ROTATE M4 4MM 5PK (BLADE)
CANISTER SUCT 1200ML W/VALVE (MISCELLANEOUS) ×3 IMPLANT
COAGULATOR SUCT 6 FR SWTCH (ELECTROSURGICAL) ×1
COAGULATOR SUCT SWTCH 10FR 6 (ELECTROSURGICAL) ×2 IMPLANT
CORDS BIPOLAR (ELECTRODE) IMPLANT
DECANTER SPIKE VIAL GLASS SM (MISCELLANEOUS) IMPLANT
DRSG NASOPORE 8CM (GAUZE/BANDAGES/DRESSINGS) IMPLANT
DRSG TELFA 3X8 NADH (GAUZE/BANDAGES/DRESSINGS) IMPLANT
ELECT REM PT RETURN 9FT ADLT (ELECTROSURGICAL) ×3
ELECTRODE REM PT RTRN 9FT ADLT (ELECTROSURGICAL) ×1 IMPLANT
GLOVE BIO SURGEON STRL SZ 6 (GLOVE) ×3 IMPLANT
GLOVE BIO SURGEON STRL SZ7.5 (GLOVE) ×3 IMPLANT
GLOVE SURG SS PI 7.0 STRL IVOR (GLOVE) ×3 IMPLANT
GOWN STRL REUS W/ TWL LRG LVL3 (GOWN DISPOSABLE) ×4 IMPLANT
GOWN STRL REUS W/TWL LRG LVL3 (GOWN DISPOSABLE) ×8
HEMOSTAT SURGICEL .5X2 ABSORB (HEMOSTASIS) IMPLANT
IV NS 500ML (IV SOLUTION) ×2
IV NS 500ML BAXH (IV SOLUTION) ×1 IMPLANT
NEEDLE PRECISIONGLIDE 27X1.5 (NEEDLE) ×3 IMPLANT
NS IRRIG 1000ML POUR BTL (IV SOLUTION) IMPLANT
PACK BASIN DAY SURGERY FS (CUSTOM PROCEDURE TRAY) ×3 IMPLANT
PACK ENT DAY SURGERY (CUSTOM PROCEDURE TRAY) ×3 IMPLANT
PATTIES SURGICAL .5 X3 (DISPOSABLE) ×3 IMPLANT
SHEET SILASTIC 8X6X.030 25-30 (MISCELLANEOUS) IMPLANT
SLEEVE SCD COMPRESS KNEE MED (MISCELLANEOUS) IMPLANT
SPLINT NASAL AIRWAY SILICONE (MISCELLANEOUS) IMPLANT
SPONGE GAUZE 2X2 8PLY STER LF (GAUZE/BANDAGES/DRESSINGS) ×1
SPONGE GAUZE 2X2 8PLY STRL LF (GAUZE/BANDAGES/DRESSINGS) ×2 IMPLANT
SUT CHROMIC 2 0 SH (SUTURE) ×3 IMPLANT
SUT CHROMIC 4 0 P 3 18 (SUTURE) ×3 IMPLANT
SUT ETHILON 3 0 PS 1 (SUTURE) IMPLANT
SUT PLAIN 4 0 ~~LOC~~ 1 (SUTURE) ×3 IMPLANT
TOWEL OR 17X24 6PK STRL BLUE (TOWEL DISPOSABLE) ×6 IMPLANT
TRAY DSU PREP LF (CUSTOM PROCEDURE TRAY) ×3 IMPLANT
YANKAUER SUCT BULB TIP NO VENT (SUCTIONS) ×3 IMPLANT

## 2015-05-23 NOTE — H&P (Signed)
Nathaniel Mahoney is an 50 y.o. male.   Chief Complaint: septal deviation, turbinate hypertrophy HPI: 50 year old male with nasal obstruction for 20 years and was found to have a septal deviation and turbinate hypertrophy not responsive to medical therapy.  Presents for surgery.  Past Medical History  Diagnosis Date  . GERD (gastroesophageal reflux disease)   . Allergic rhinitis   . Asthma     exercise induced    Past Surgical History  Procedure Laterality Date  . Hand surgery  1990's     for boxer's fracture   . L shoulder surgery    . Rt wrist surgery    . Esophagogastroduodenoscopy  04/09/2011    Procedure: ESOPHAGOGASTRODUODENOSCOPY (EGD);  Surgeon: Beryle Beams, MD;  Location: [facility];  Service: Endoscopy;  Laterality: N/A;  . Colonoscopy  04/09/2011    Procedure: COLONOSCOPY;  Surgeon: Beryle Beams, MD;  Location: [facility];  Service: Endoscopy;  Laterality: N/A;    History reviewed. No pertinent family history. Social History:  reports that he has quit smoking. He does not have any smokeless tobacco history on file. He reports that he does not drink alcohol or use illicit drugs.  Allergies:  Allergies  Allergen Reactions  . Crestor [Rosuvastatin Calcium]     Medications Prior to Admission  Medication Sig Dispense Refill  . esomeprazole (NEXIUM) 40 MG capsule Take 40 mg by mouth daily before breakfast.      . mometasone (NASONEX) 50 MCG/ACT nasal spray Place 2 sprays into the nose as needed (allergies).     . simvastatin (ZOCOR) 40 MG tablet Take 40 mg by mouth every evening.      Marland Kitchen albuterol (PROVENTIL HFA;VENTOLIN HFA) 108 (90 BASE) MCG/ACT inhaler Inhale 1-2 puffs into the lungs every 6 (six) hours as needed for wheezing or shortness of breath.    . ferrous sulfate 325 (65 FE) MG tablet Take 325 mg by mouth daily with breakfast.      . fexofenadine (ALLEGRA) 180 MG tablet Take 180 mg by mouth daily as needed for allergies or rhinitis.      No results found for  this or any previous visit (from the past 48 hour(s)). No results found.  Review of Systems  Neurological: Positive for headaches.  All other systems reviewed and are negative.   Blood pressure 124/82, pulse 60, temperature 97.8 F (36.6 C), temperature source Oral, resp. rate 20, height 5\' 10"  (1.778 m), weight 84.369 kg (186 lb), SpO2 100 %. Physical Exam  Constitutional: He is oriented to person, place, and time. He appears well-developed and well-nourished. No distress.  HENT:  Head: Normocephalic and atraumatic.  Right Ear: External ear normal.  Left Ear: External ear normal.  Mouth/Throat: Oropharynx is clear and moist.  Septum deviates to the right, moderate turbinate hypertrophy.  Eyes: Conjunctivae and EOM are normal. Pupils are equal, round, and reactive to light.  Neck: Normal range of motion. Neck supple.  Cardiovascular: Normal rate.   Respiratory: Effort normal.  Musculoskeletal: Normal range of motion.  Neurological: He is alert and oriented to person, place, and time. No cranial nerve deficit.  Skin: Skin is warm and dry.  Psychiatric: He has a normal mood and affect. His behavior is normal. Judgment and thought content normal.     Assessment/Plan Septal deviation, turbinate hypertrophy To OR for septoplasty and turbinate reduction.  Melida Quitter, MD 05/23/2015, 9:23 AM

## 2015-05-23 NOTE — Discharge Instructions (Signed)
Call Dr. Redmond Baseman if bleeding exceeds 4 SATURATED drip pads in 1 hour after icing and resting.  Call your surgeon if you experience:   1.  Fever over 101.0. 2.  Inability to urinate. 3.  Nausea and/or vomiting. 4.  Extreme swelling or bruising at the surgical site. 5.  Continued bleeding from the incision. 6.  Increased pain, redness or drainage from the incision. 7.  Problems related to your pain medication. 8.  Any problems and/or concerns   Post Anesthesia Home Care Instructions  Activity: Get plenty of rest for the remainder of the day. A responsible adult should stay with you for 24 hours following the procedure.  For the next 24 hours, DO NOT: -Drive a car -Paediatric nurse -Drink alcoholic beverages -Take any medication unless instructed by your physician -Make any legal decisions or sign important papers.  Meals: Start with liquid foods such as gelatin or soup. Progress to regular foods as tolerated. Avoid greasy, spicy, heavy foods. If nausea and/or vomiting occur, drink only clear liquids until the nausea and/or vomiting subsides. Call your physician if vomiting continues.  Special Instructions/Symptoms: Your throat may feel dry or sore from the anesthesia or the breathing tube placed in your throat during surgery. If this causes discomfort, gargle with warm salt water. The discomfort should disappear within 24 hours.  If you had a scopolamine patch placed behind your ear for the management of post- operative nausea and/or vomiting:  1. The medication in the patch is effective for 72 hours, after which it should be removed.  Wrap patch in a tissue and discard in the trash. Wash hands thoroughly with soap and water. 2. You may remove the patch earlier than 72 hours if you experience unpleasant side effects which may include dry mouth, dizziness or visual disturbances. 3. Avoid touching the patch. Wash your hands with soap and water after contact with the patch.

## 2015-05-23 NOTE — Transfer of Care (Signed)
Immediate Anesthesia Transfer of Care Note  Patient: Nathaniel Mahoney  Procedure(s) Performed: Procedure(s): NASAL SEPTOPLASTY WITH  BILATERAL TURBINATE REDUCTION (N/A)  Patient Location: PACU  Anesthesia Type:General  Level of Consciousness: sedated  Airway & Oxygen Therapy: Patient Spontanous Breathing, Patient connected to face mask and aerosol face mask  Post-op Assessment: Report given to RN and Post -op Vital signs reviewed and stable  Post vital signs: Reviewed and stable  Last Vitals:  Filed Vitals:   05/23/15 0800  BP: 124/82  Pulse: 60  Temp: 36.6 C  Resp: 20    Complications: No apparent anesthesia complications

## 2015-05-23 NOTE — Anesthesia Preprocedure Evaluation (Signed)
Anesthesia Evaluation  Patient identified by MRN, date of birth, ID band Patient awake    Reviewed: Allergy & Precautions, NPO status , Patient's Chart, lab work & pertinent test results  Airway Mallampati: II  TM Distance: >3 FB Neck ROM: Full    Dental  (+) Teeth Intact, Dental Advisory Given   Pulmonary former smoker,    breath sounds clear to auscultation       Cardiovascular  Rhythm:Regular Rate:Normal     Neuro/Psych    GI/Hepatic   Endo/Other    Renal/GU      Musculoskeletal   Abdominal   Peds  Hematology   Anesthesia Other Findings   Reproductive/Obstetrics                             Anesthesia Physical Anesthesia Plan  ASA: II  Anesthesia Plan: General   Post-op Pain Management:    Induction: Intravenous  Airway Management Planned: Oral ETT  Additional Equipment:   Intra-op Plan:   Post-operative Plan: Extubation in OR  Informed Consent: I have reviewed the patients History and Physical, chart, labs and discussed the procedure including the risks, benefits and alternatives for the proposed anesthesia with the patient or authorized representative who has indicated his/her understanding and acceptance.   Dental advisory given  Plan Discussed with: CRNA and Anesthesiologist  Anesthesia Plan Comments: (GERD Mild asthma lungs clear Former smoker)        Anesthesia Quick Evaluation

## 2015-05-23 NOTE — Anesthesia Postprocedure Evaluation (Signed)
Anesthesia Post Note  Patient: Nathaniel Mahoney  Procedure(s) Performed: Procedure(s) (LRB): NASAL SEPTOPLASTY WITH  BILATERAL TURBINATE REDUCTION (N/A)  Patient location during evaluation: PACU Anesthesia Type: General Level of consciousness: awake, awake and alert and oriented Pain management: pain level controlled Vital Signs Assessment: post-procedure vital signs reviewed and stable Respiratory status: spontaneous breathing, nonlabored ventilation and respiratory function stable Anesthetic complications: no    Last Vitals:  Filed Vitals:   05/23/15 1107 05/23/15 1130  BP: 126/86 119/79  Pulse: 66 68  Temp:  36.5 C  Resp: 14 16    Last Pain:  Filed Vitals:   05/23/15 1134  PainSc: 0-No pain                 Racer Quam COKER

## 2015-05-23 NOTE — Brief Op Note (Signed)
05/23/2015  10:26 AM  PATIENT:  Karie Georges  50 y.o. male  PRE-OPERATIVE DIAGNOSIS:  DEVIATED SEPTUM,TURBINATE HYPERTROPHY  POST-OPERATIVE DIAGNOSIS:  DEVIATED SEPTUM,TURBINATE HYPERTROPHY  PROCEDURE:  Procedure(s): NASAL SEPTOPLASTY WITH  BILATERAL TURBINATE REDUCTION (N/A)  SURGEON:  Surgeon(s) and Role:    * Melida Quitter, MD - Primary  PHYSICIAN ASSISTANT:   ASSISTANTS: none   ANESTHESIA:   general  EBL:  Total I/O In: 1000 [I.V.:1000] Out: -   BLOOD ADMINISTERED:none  DRAINS: none   LOCAL MEDICATIONS USED:  LIDOCAINE   SPECIMEN:  No Specimen  DISPOSITION OF SPECIMEN:  N/A  COUNTS:  YES  TOURNIQUET:  * No tourniquets in log *  DICTATION: .Other Dictation: Dictation Number [id number]  PLAN OF CARE: Discharge to home after PACU  PATIENT DISPOSITION:  PACU - hemodynamically stable.   Delay start of Pharmacological VTE agent (>24hrs) due to surgical blood loss or risk of bleeding: no

## 2015-05-23 NOTE — Anesthesia Procedure Notes (Signed)
Procedure Name: Intubation Date/Time: 05/23/2015 9:38 AM Performed by: Marrianne Mood Pre-anesthesia Checklist: Patient identified, Emergency Drugs available, Suction available, Patient being monitored and Timeout performed Patient Re-evaluated:Patient Re-evaluated prior to inductionOxygen Delivery Method: Circle System Utilized Preoxygenation: Pre-oxygenation with 100% oxygen Intubation Type: IV induction Ventilation: Mask ventilation without difficulty Laryngoscope Size: Miller and 3 Tube type: Oral Tube size: 8.0 mm Number of attempts: 1 Airway Equipment and Method: Stylet and Oral airway Placement Confirmation: ETT inserted through vocal cords under direct vision,  positive ETCO2 and breath sounds checked- equal and bilateral Secured at: 21 cm Tube secured with: Tape Dental Injury: Teeth and Oropharynx as per pre-operative assessment

## 2015-05-23 NOTE — Op Note (Signed)
NAMEKRISHAN, TALERICO NO.:  [id number]  MEDICAL RECORD NO.:  [id number]  LOCATION:                                 FACILITY:  PHYSICIAN:  Onnie Graham, MD     DATE OF BIRTH:  [birth year]  DATE OF PROCEDURE:  05/23/2015 DATE OF DISCHARGE:                              OPERATIVE REPORT   PREOPERATIVE DIAGNOSES: 1. Nasal septal deviation. 2. Inferior turbinate hypertrophy.  POSTOPERATIVE DIAGNOSES: 1. Nasal septal deviation. 2. Inferior turbinate hypertrophy.  PROCEDURES: 1. Nasal septoplasty. 2. Bilateral inferior turbinate reduction.  SURGEON:  Onnie Graham, MD  ANESTHESIA:  General endotracheal anesthesia.  COMPLICATIONS:  None.  INDICATION:  The patient is a 50 year old male who has a 20-year history of nasal obstruction that did not respond to medical therapy.  He was found to have a deviated septum toward the right side and moderate inferior turbinate hypertrophy and presents to the operating room for surgical management.  FINDINGS:  As above.  DESCRIPTION OF PROCEDURE:  The patient was identified in the holding room and informed consent having been obtained including discussion of risks, benefits, alternatives, the patient was brought to the operative suite and put on the operative table in supine position.  Anesthesia was induced.  The patient was intubated by the Anesthesia Team without difficulty.  The patient was given intravenous antibiotics during the case.  The eyes taped closed, and the midface was prepped and draped in usual sterile fashion.  Afrin pledgets were placed on both sides of the nose for several minutes and then removed.  The septum was injected on both sides using 1% lidocaine with 1:100,000 of epinephrine.  A Killian incision was made on the left side and the subperichondrial flap was elevated back beyond the bony cartilaginous junction.  The junction was divided and posterior septal bone was then isolated from  surrounding soft tissues and removed in a piecemeal fashion including fairly sizable spur inferiorly.  Dissection was then performed along the inferior portion of the septum where a bony spur toward the right side was isolated and removed with an osteotome.  A posterior segment of the quadrangular cartilage was then removed with a Soil scientist.  This resulted in a relatively midline septum.  There were couple of holes made in the septal flaps on either side, but not across from each other. With the septum in good midline position, the inferior turbinates were then injected with local anesthetic.  Stab incision was made at the anterior head of each inferior turbinate and soft tissues were elevated using a Soil scientist.  Turbinate blade on the microdebrider was then used to remove submucosal tissues keeping the overlying mucosa and underlying bone intact.  Soft tissues were redraped and turbinates were lateralized using a Soil scientist.  At this point, the nasal passages were suctioned and Doyle splints were placed on both sides, coated in Bactroban ointment.  The Killian incision was closed with 4-0 chromic suture in a simple interrupted fashion.  The stents were placed in the proper position and secured with single 2-0 chromic suture in a through- and-through mattress stitch.  The throat was suctioned and he was returned  to anesthesia for wake up after the face was cleaned off.  He was extubated and moved to the recovery room in stable condition.     Onnie Graham, MD     DDB/MEDQ  D:  05/23/2015  T:  05/23/2015  Job:  [id number]

## 2015-05-26 ENCOUNTER — Encounter (HOSPITAL_BASED_OUTPATIENT_CLINIC_OR_DEPARTMENT_OTHER): Payer: Self-pay | Admitting: Otolaryngology

## 2015-05-30 MED FILL — SIMVASTATIN 40 MG TABLET: 40 | 30 days supply | Qty: 30 | Fill #1

## 2015-05-30 MED FILL — EZETIMIBE 10 MG TABLET: 10 | 30 days supply | Qty: 30 | Fill #1

## 2015-07-04 MED FILL — SIMVASTATIN 40 MG TABLET: 40 | 90 days supply | Qty: 90 | Fill #2

## 2015-07-04 MED FILL — EZETIMIBE 10 MG TABLET: 10 | 90 days supply | Qty: 90 | Fill #2

## 2015-08-08 MED FILL — ESOMEPRAZOLE MAG DR 40 MG C: 40 | 90 days supply | Qty: 90 | Fill #1

## 2015-08-28 DIAGNOSIS — R7301 Impaired fasting glucose: Secondary | ICD-10-CM | POA: Diagnosis not present

## 2015-08-28 DIAGNOSIS — E782 Mixed hyperlipidemia: Secondary | ICD-10-CM | POA: Diagnosis not present

## 2015-09-02 DIAGNOSIS — R7301 Impaired fasting glucose: Secondary | ICD-10-CM | POA: Diagnosis not present

## 2015-09-02 DIAGNOSIS — E782 Mixed hyperlipidemia: Secondary | ICD-10-CM | POA: Diagnosis not present

## 2015-11-06 MED FILL — EZETIMIBE 10 MG TABLET: 10 | 90 days supply | Qty: 90 | Fill #0

## 2015-11-06 MED FILL — SIMVASTATIN 40 MG TABLET: 40 | 90 days supply | Qty: 90 | Fill #0

## 2015-11-06 MED FILL — ESOMEPRAZOLE MAG DR 40 MG C: 40 | 90 days supply | Qty: 90 | Fill #0

## 2016-02-02 MED FILL — ESOMEPRAZOLE MAG DR 40 MG C: 40 | 90 days supply | Qty: 90 | Fill #1

## 2016-02-02 MED FILL — EZETIMIBE 10 MG TABLET: 10 | 90 days supply | Qty: 90 | Fill #1

## 2016-02-02 MED FILL — SIMVASTATIN 40 MG TABLET: 40 | 90 days supply | Qty: 90 | Fill #1

## 2016-02-24 DIAGNOSIS — E782 Mixed hyperlipidemia: Secondary | ICD-10-CM | POA: Diagnosis not present

## 2016-02-24 DIAGNOSIS — R7301 Impaired fasting glucose: Secondary | ICD-10-CM | POA: Diagnosis not present

## 2016-03-02 DIAGNOSIS — E782 Mixed hyperlipidemia: Secondary | ICD-10-CM | POA: Diagnosis not present

## 2016-03-02 DIAGNOSIS — R7301 Impaired fasting glucose: Secondary | ICD-10-CM | POA: Diagnosis not present

## 2016-03-16 DIAGNOSIS — G8929 Other chronic pain: Secondary | ICD-10-CM | POA: Diagnosis not present

## 2016-03-16 DIAGNOSIS — M25511 Pain in right shoulder: Secondary | ICD-10-CM | POA: Diagnosis not present

## 2016-04-13 DIAGNOSIS — M25511 Pain in right shoulder: Secondary | ICD-10-CM | POA: Diagnosis not present

## 2016-04-13 DIAGNOSIS — G8929 Other chronic pain: Secondary | ICD-10-CM | POA: Diagnosis not present

## 2016-04-15 ENCOUNTER — Other Ambulatory Visit (HOSPITAL_COMMUNITY): Payer: Self-pay | Admitting: Specialist

## 2016-04-15 DIAGNOSIS — M25511 Pain in right shoulder: Principal | ICD-10-CM

## 2016-04-15 DIAGNOSIS — G8929 Other chronic pain: Secondary | ICD-10-CM

## 2016-04-23 ENCOUNTER — Ambulatory Visit (HOSPITAL_COMMUNITY)
Admission: RE | Admit: 2016-04-23 | Discharge: 2016-04-23 | Disposition: A | Discharge status: Home / Self Care | Payer: 59 | Source: Ambulatory Visit | Attending: Specialist | Admitting: Specialist

## 2016-04-23 DIAGNOSIS — M19011 Primary osteoarthritis, right shoulder: Secondary | ICD-10-CM | POA: Diagnosis not present

## 2016-04-23 DIAGNOSIS — M7591 Shoulder lesion, unspecified, right shoulder: Secondary | ICD-10-CM | POA: Insufficient documentation

## 2016-04-23 DIAGNOSIS — M25511 Pain in right shoulder: Principal | ICD-10-CM

## 2016-04-23 DIAGNOSIS — G8929 Other chronic pain: Secondary | ICD-10-CM

## 2016-04-23 DIAGNOSIS — X58XXXA Exposure to other specified factors, initial encounter: Secondary | ICD-10-CM | POA: Diagnosis not present

## 2016-04-23 DIAGNOSIS — S43431A Superior glenoid labrum lesion of right shoulder, initial encounter: Secondary | ICD-10-CM | POA: Insufficient documentation

## 2016-04-23 MED ORDER — IOPAMIDOL (ISOVUE-M 200) INJECTION 41%
20.0000 mL | Freq: Once | INTRAMUSCULAR | Status: AC
  Administered 2016-04-23: 10 mL via INTRA_ARTICULAR

## 2016-04-23 MED ORDER — GADOBENATE DIMEGLUMINE 529 MG/ML IV SOLN
5.0000 mL | Freq: Once | INTRAVENOUS | Status: AC | PRN
  Administered 2016-04-23: 0.05 mL via INTRA_ARTICULAR

## 2016-04-23 MED ORDER — LIDOCAINE HCL (PF) 1 % IJ SOLN
10.0000 mL | Freq: Once | INTRAMUSCULAR | Status: AC
  Administered 2016-04-23: 2 mL

## 2016-04-26 MED FILL — ESOMEPRAZOLE MAG DR 40 MG C: 40 | 90 days supply | Qty: 90 | Fill #0

## 2016-05-17 DIAGNOSIS — S43431A Superior glenoid labrum lesion of right shoulder, initial encounter: Secondary | ICD-10-CM | POA: Diagnosis not present

## 2016-05-17 DIAGNOSIS — M19011 Primary osteoarthritis, right shoulder: Secondary | ICD-10-CM | POA: Diagnosis not present

## 2016-07-26 MED FILL — SIMVASTATIN 40 MG TABLET: 40 | 89 days supply | Qty: 30 | Fill #0

## 2016-07-26 MED FILL — EZETIMIBE 10 MG TAB: 10 | 90 days supply | Qty: 90 | Fill #0

## 2016-07-26 MED FILL — ESOMEPRAZOLE MAG DR 40 MG C: 40 | 90 days supply | Qty: 90 | Fill #1

## 2016-09-01 DIAGNOSIS — E782 Mixed hyperlipidemia: Secondary | ICD-10-CM | POA: Diagnosis not present

## 2016-09-01 DIAGNOSIS — R7301 Impaired fasting glucose: Secondary | ICD-10-CM | POA: Diagnosis not present

## 2016-09-03 DIAGNOSIS — Z0000 Encounter for general adult medical examination without abnormal findings: Secondary | ICD-10-CM | POA: Diagnosis not present

## 2016-09-03 DIAGNOSIS — Z6828 Body mass index (BMI) 28.0-28.9, adult: Secondary | ICD-10-CM | POA: Diagnosis not present

## 2016-09-03 DIAGNOSIS — J302 Other seasonal allergic rhinitis: Secondary | ICD-10-CM | POA: Diagnosis not present

## 2016-09-03 DIAGNOSIS — R7301 Impaired fasting glucose: Secondary | ICD-10-CM | POA: Diagnosis not present

## 2016-09-03 DIAGNOSIS — E782 Mixed hyperlipidemia: Secondary | ICD-10-CM | POA: Diagnosis not present

## 2016-10-08 MED FILL — ESOMEPRAZOLE MAG DR 40 MG C: 40 | 90 days supply | Qty: 90 | Fill #0

## 2016-10-08 MED FILL — EZETIMIBE 10 MG TAB: 10 | 90 days supply | Qty: 90 | Fill #0

## 2016-10-08 MED FILL — SIMVASTATIN 40 MG TABLET: 40 | 90 days supply | Qty: 90 | Fill #0

## 2016-12-08 DIAGNOSIS — D509 Iron deficiency anemia, unspecified: Secondary | ICD-10-CM | POA: Diagnosis not present

## 2016-12-08 DIAGNOSIS — Z1211 Encounter for screening for malignant neoplasm of colon: Secondary | ICD-10-CM | POA: Diagnosis not present

## 2016-12-10 DIAGNOSIS — D509 Iron deficiency anemia, unspecified: Secondary | ICD-10-CM | POA: Diagnosis not present

## 2017-01-14 MED FILL — EZETIMIBE 10 MG TABS: 10 | 90 days supply | Qty: 90 | Fill #1

## 2017-01-14 MED FILL — ESOMEPRAZOLE MAG DR 40 MG C: 40 | 90 days supply | Qty: 90 | Fill #1

## 2017-01-14 MED FILL — SIMVASTATIN 40 MG TABLET: 40 | 90 days supply | Qty: 90 | Fill #1

## 2017-03-07 DIAGNOSIS — E782 Mixed hyperlipidemia: Secondary | ICD-10-CM | POA: Diagnosis not present

## 2017-03-07 DIAGNOSIS — R7301 Impaired fasting glucose: Secondary | ICD-10-CM | POA: Diagnosis not present

## 2017-03-09 DIAGNOSIS — Z1389 Encounter for screening for other disorder: Secondary | ICD-10-CM | POA: Diagnosis not present

## 2017-03-09 DIAGNOSIS — J302 Other seasonal allergic rhinitis: Secondary | ICD-10-CM | POA: Diagnosis not present

## 2017-03-09 DIAGNOSIS — E782 Mixed hyperlipidemia: Secondary | ICD-10-CM | POA: Diagnosis not present

## 2017-03-09 DIAGNOSIS — R7301 Impaired fasting glucose: Secondary | ICD-10-CM | POA: Diagnosis not present

## 2017-03-09 DIAGNOSIS — Z6827 Body mass index (BMI) 27.0-27.9, adult: Secondary | ICD-10-CM | POA: Diagnosis not present

## 2017-04-12 DIAGNOSIS — H524 Presbyopia: Secondary | ICD-10-CM | POA: Diagnosis not present

## 2017-04-13 MED FILL — ESOMEPRAZOLE MAG DR 40 MG C: 40 | 90 days supply | Qty: 90 | Fill #0

## 2017-06-08 MED FILL — EZETIMIBE 10 MG TABS: 10 | 90 days supply | Qty: 90 | Fill #2

## 2017-06-08 MED FILL — SIMVASTATIN 40 MG TABLET: 40 | 30 days supply | Qty: 30 | Fill #1

## 2017-07-12 MED FILL — ESOMEPRAZOLE MAG DR 40 MG C: 40 | 90 days supply | Qty: 90 | Fill #1

## 2017-08-11 MED FILL — SIMVASTATIN 40 MG TABLET: 40 | 90 days supply | Qty: 90 | Fill #0

## 2017-09-13 DIAGNOSIS — E119 Type 2 diabetes mellitus without complications: Secondary | ICD-10-CM | POA: Diagnosis not present

## 2017-09-13 DIAGNOSIS — E782 Mixed hyperlipidemia: Secondary | ICD-10-CM | POA: Diagnosis not present

## 2017-09-27 DIAGNOSIS — E782 Mixed hyperlipidemia: Secondary | ICD-10-CM | POA: Diagnosis not present

## 2017-09-27 DIAGNOSIS — J302 Other seasonal allergic rhinitis: Secondary | ICD-10-CM | POA: Diagnosis not present

## 2017-09-27 DIAGNOSIS — R7301 Impaired fasting glucose: Secondary | ICD-10-CM | POA: Diagnosis not present

## 2017-09-27 DIAGNOSIS — S43439A Superior glenoid labrum lesion of unspecified shoulder, initial encounter: Secondary | ICD-10-CM | POA: Diagnosis not present

## 2017-09-27 DIAGNOSIS — Z6827 Body mass index (BMI) 27.0-27.9, adult: Secondary | ICD-10-CM | POA: Diagnosis not present

## 2017-09-27 DIAGNOSIS — Z0000 Encounter for general adult medical examination without abnormal findings: Secondary | ICD-10-CM | POA: Diagnosis not present

## 2017-10-13 MED FILL — ESOMEPRAZOLE MAG DR 40 MG C: 40 | 90 days supply | Qty: 90 | Fill #0

## 2017-11-25 MED FILL — SUPREP BOWEL PREP KIT: 17.5-3.13-1 | 1 days supply | Qty: 354 | Fill #0

## 2017-11-25 MED FILL — SIMVASTATIN 40 MG TABLET: 40 | 90 days supply | Qty: 90 | Fill #0

## 2017-12-01 DIAGNOSIS — Z1211 Encounter for screening for malignant neoplasm of colon: Secondary | ICD-10-CM | POA: Diagnosis not present

## 2017-12-13 IMAGING — MR MR SHOULDER*R* W/CM
4 of 6 series · 19 of 40 positions shown · IV contrast (2ml mh)
Comparison: None.

CLINICAL DATA: Right shoulder pain for 3 years progressively
worsening. Rule out labral tear.

EXAM:
MR ARTHROGRAM OF THE RIGHT SHOULDER
TECHNIQUE: Multiplanar, multisequence MR imaging of the right shoulder was
performed following the administration of intra-articular contrast.
CONTRAST:  See Injection Documentation.

[Series 6: T1 fat-sat · axial · 3.0mm · 0.23mm/px · z∈[+10,+76]mm · 3 of 26 slices shown (1 of 2)]
[im 6/26]
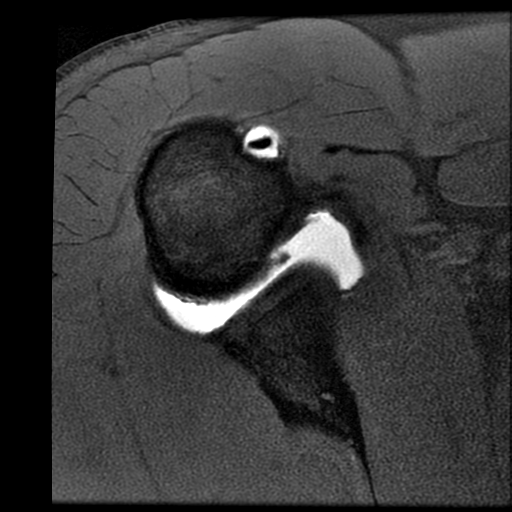
[im 16/26]
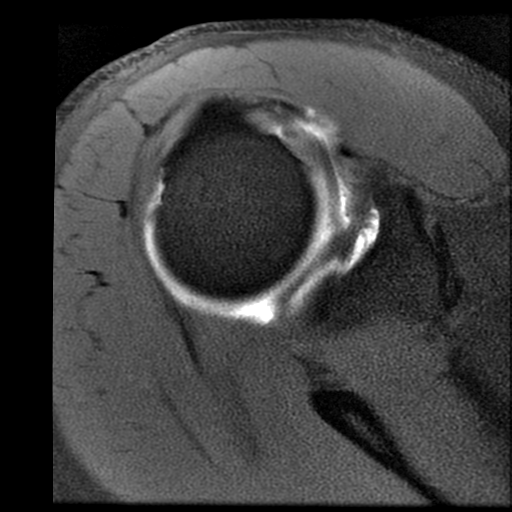
[im 26/26]
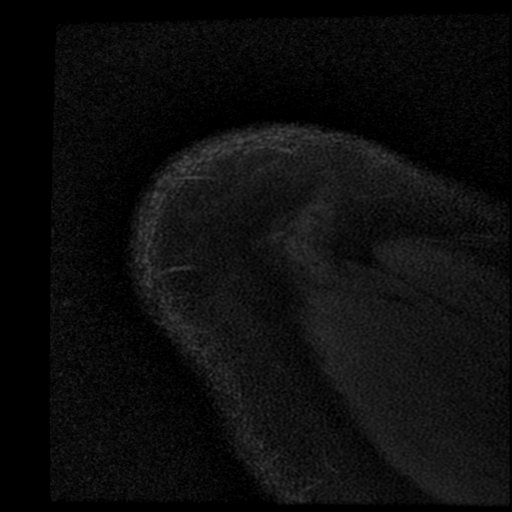

[Series 7: T1 fat-sat · sagittal · 3.0mm · 0.27mm/px · 3 of 33 slices shown (2 of 2)]
[im 6/33]
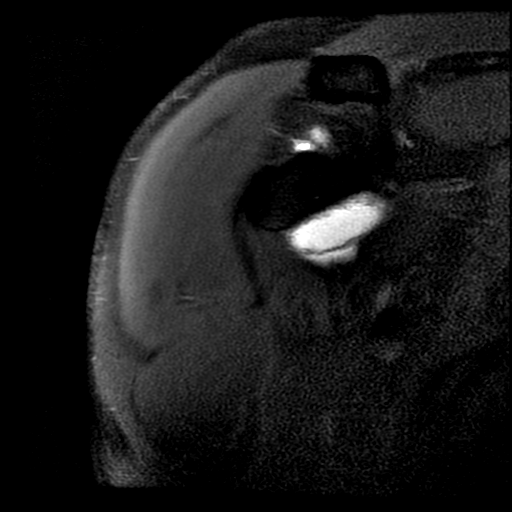
[im 17/33]
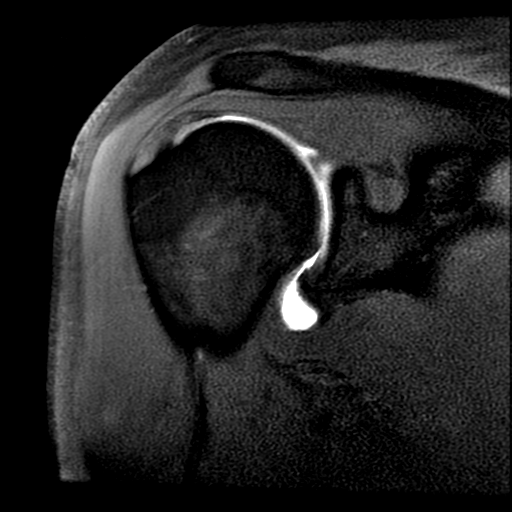
[im 27/33]
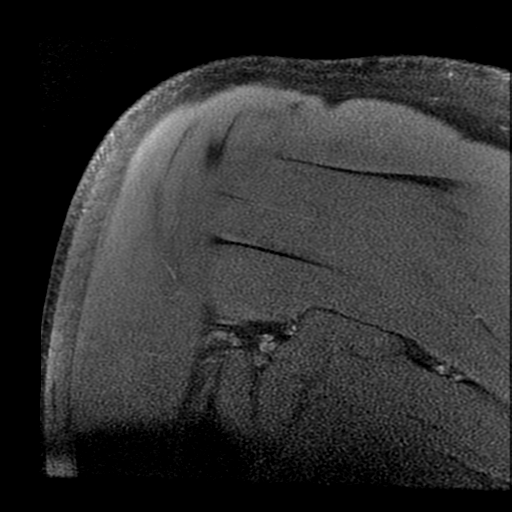

[Series 9: T2 fat-sat · sagittal · 3.0mm · 0.27mm/px · 7 of 33 slices shown (1 of 2)]
[im 1/33]
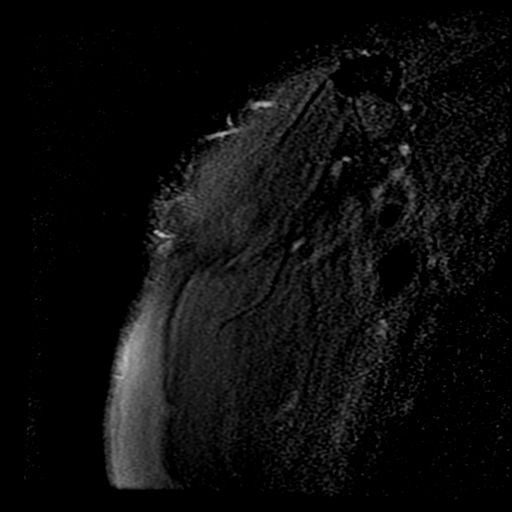
[im 6/33]
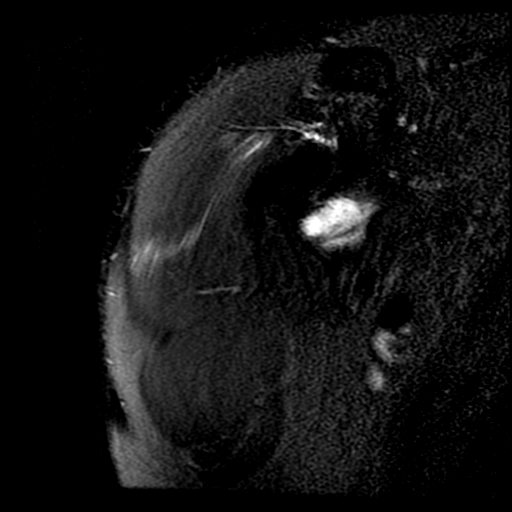
[im 11/33]
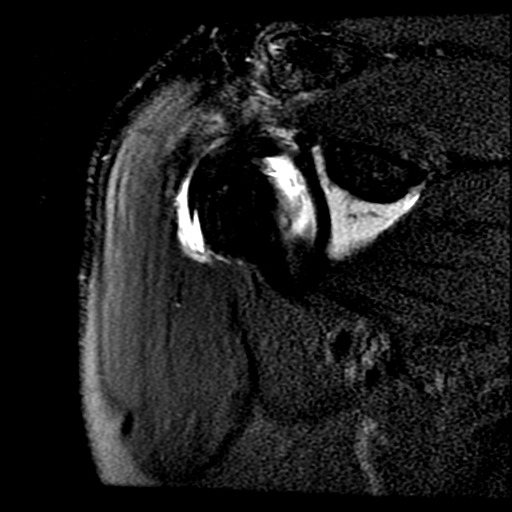
[im 17/33]
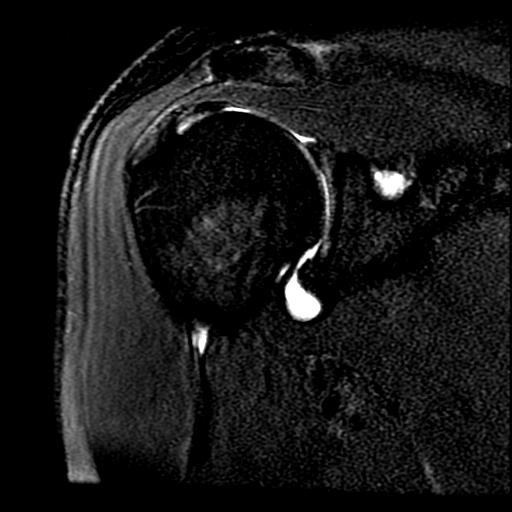
[im 22/33]
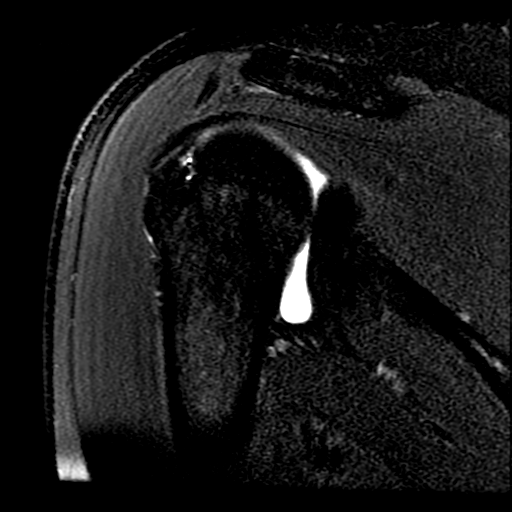
[im 27/33]
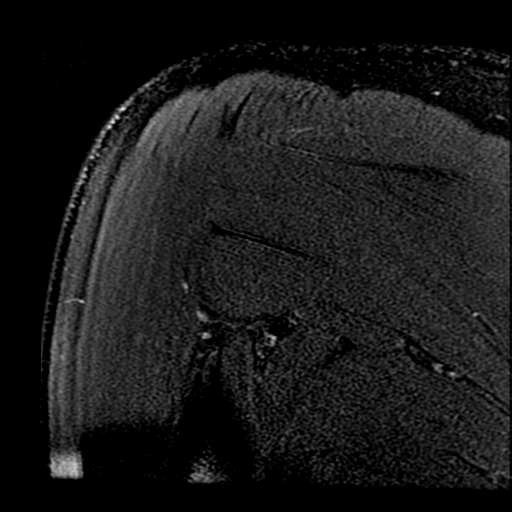
[im 33/33]
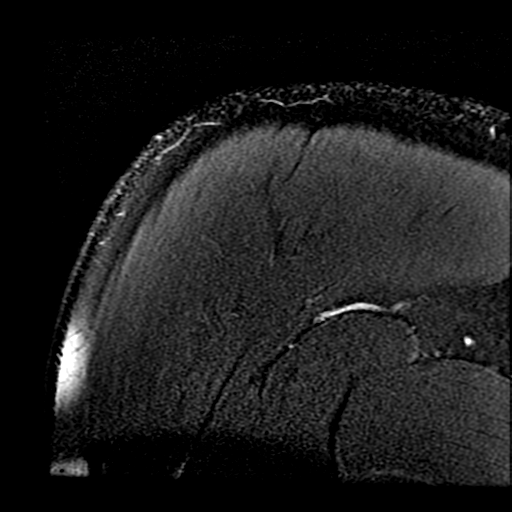

[Series 10: T2 fat-sat · coronal · 3.0mm · 0.27mm/px · 6 of 29 slices shown (2 of 2)]
[im 1/29]
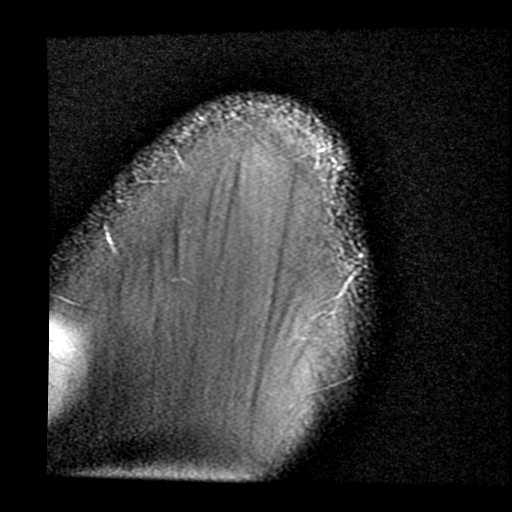
[im 6/29]
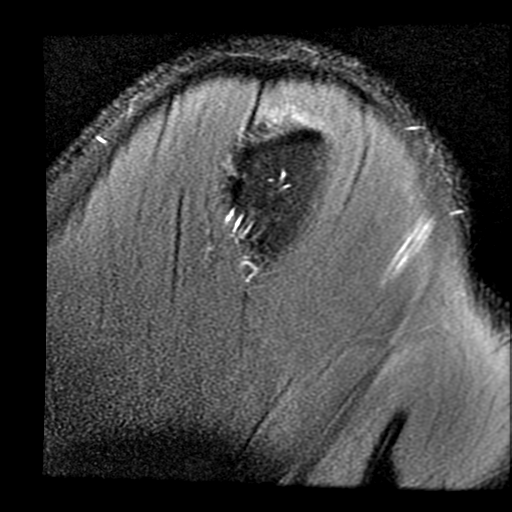
[im 12/29]
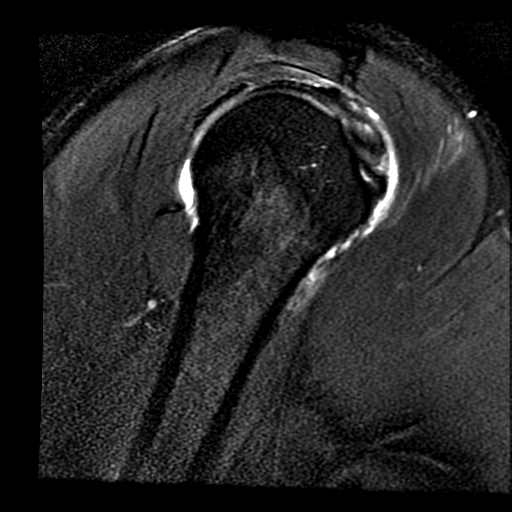
[im 17/29]
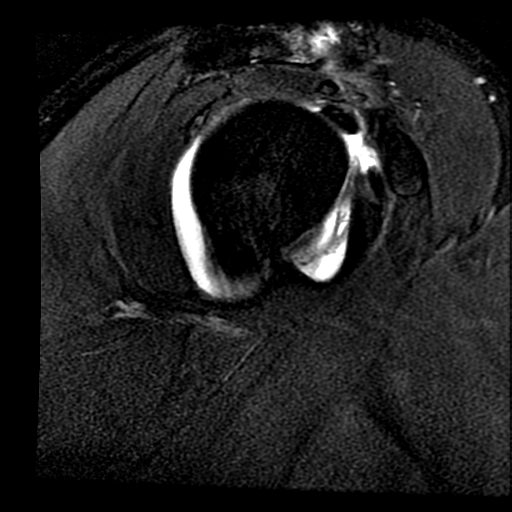
[im 23/29]
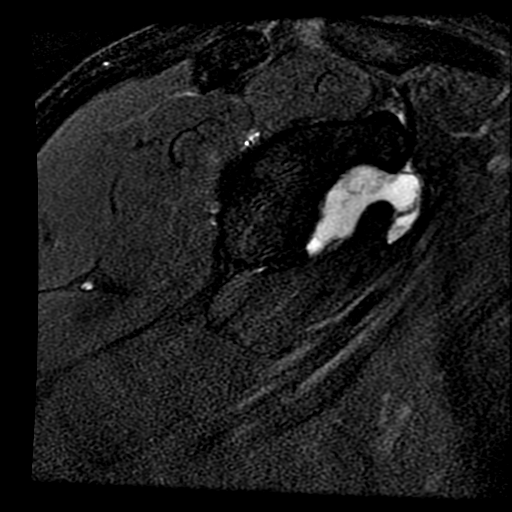
[im 29/29]
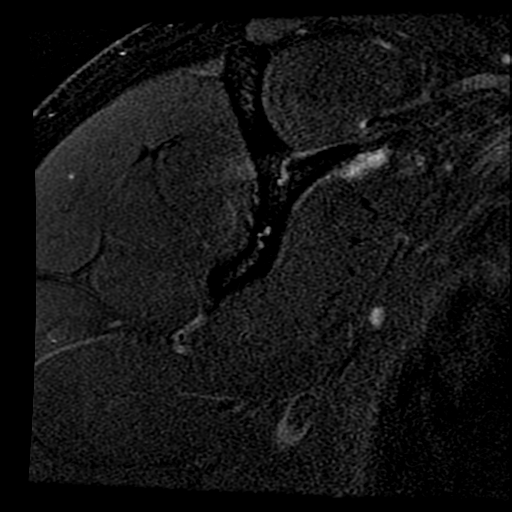

[19 of 40 positions shown; findings below may reference images not displayed]

FINDINGS: Rotator cuff: Minimal articular surface fraying of the supraspinous
associated with mild tendinosis. No full-thickness rotator cuff
tear.

Muscles: No muscular atrophy nor abnormal intramuscular signal.

Biceps long head: Intact

Acromioclavicular Joint: Mild to moderate AC joint reactive marrow
edema, trace intracapsular fluid and minimal undersurface spurring
impressing upon the myotendinous junction of the supraspinous. Type
2 acromial shape.

Glenohumeral Joint: No intra-articular loose bodies. Capsular
distention with contrast.

Labrum: SLAP tear of the glenoid labrum from approximately 9 to 3
o'clock position.

Bones: No acute fracture. Marrow edema likely reactive about the AC
joint.

Glenohumeral ligaments: Intact
IMPRESSION: 1. SLAP tear of the glenoid labrum from approximately 9 to 3 o'clock
position.
2. Minimal articular surface fraying of the distal supraspinatus
tendon associated with mild tendinosis.
3. Mild impression from AC joint osteoarthritis on the myotendinous
junction of the supraspinous tendon.

## 2018-01-09 MED FILL — ESOMEPRAZOLE MAG DR 40 MG C: 40 | 90 days supply | Qty: 90 | Fill #0

## 2018-01-10 MED FILL — EZETIMIBE 10 MG TABLET: 10 | 90 days supply | Qty: 90 | Fill #0

## 2018-02-24 MED FILL — SIMVASTATIN 40 MG TABLET: 40 | 90 days supply | Qty: 90 | Fill #0

## 2018-03-01 MED FILL — FLUTICASONE PROP 50 MCG SPR: 50 | 30 days supply | Qty: 16 | Fill #0

## 2018-04-07 ENCOUNTER — Other Ambulatory Visit: Payer: Self-pay | Admitting: Adult Health Nurse Practitioner

## 2018-04-07 DIAGNOSIS — Z6827 Body mass index (BMI) 27.0-27.9, adult: Secondary | ICD-10-CM | POA: Diagnosis not present

## 2018-04-07 DIAGNOSIS — Z1389 Encounter for screening for other disorder: Secondary | ICD-10-CM | POA: Diagnosis not present

## 2018-04-07 DIAGNOSIS — R7301 Impaired fasting glucose: Secondary | ICD-10-CM | POA: Diagnosis not present

## 2018-04-07 DIAGNOSIS — Z0000 Encounter for general adult medical examination without abnormal findings: Secondary | ICD-10-CM | POA: Diagnosis not present

## 2018-04-07 DIAGNOSIS — E782 Mixed hyperlipidemia: Secondary | ICD-10-CM | POA: Diagnosis not present

## 2018-04-07 MED FILL — ESOMEPRAZOLE MAG DR 40 MG C: 40 | 90 days supply | Qty: 90 | Fill #0

## 2018-04-12 DIAGNOSIS — J302 Other seasonal allergic rhinitis: Secondary | ICD-10-CM | POA: Diagnosis not present

## 2018-04-12 DIAGNOSIS — R7301 Impaired fasting glucose: Secondary | ICD-10-CM | POA: Diagnosis not present

## 2018-04-12 DIAGNOSIS — S43431A Superior glenoid labrum lesion of right shoulder, initial encounter: Secondary | ICD-10-CM | POA: Diagnosis not present

## 2018-04-12 DIAGNOSIS — E782 Mixed hyperlipidemia: Secondary | ICD-10-CM | POA: Diagnosis not present

## 2018-04-12 MED FILL — EZETIMIBE 10 MG TABS: 10 | 90 days supply | Qty: 90 | Fill #0

## 2018-04-17 DIAGNOSIS — H524 Presbyopia: Secondary | ICD-10-CM | POA: Diagnosis not present

## 2018-04-17 DIAGNOSIS — H5201 Hypermetropia, right eye: Secondary | ICD-10-CM | POA: Diagnosis not present

## 2018-04-17 DIAGNOSIS — H52223 Regular astigmatism, bilateral: Secondary | ICD-10-CM | POA: Diagnosis not present

## 2018-04-24 DIAGNOSIS — X32XXXA Exposure to sunlight, initial encounter: Secondary | ICD-10-CM | POA: Diagnosis not present

## 2018-04-24 DIAGNOSIS — L570 Actinic keratosis: Secondary | ICD-10-CM | POA: Diagnosis not present

## 2018-04-24 DIAGNOSIS — B078 Other viral warts: Secondary | ICD-10-CM | POA: Diagnosis not present

## 2018-04-26 MED FILL — OSELTAMIVIR PHOS 75 MG CAP: 75 | 10 days supply | Qty: 10 | Fill #0

## 2018-05-04 MED FILL — FLUTICASONE PROP 50 MCG SPR: 50 | 30 days supply | Qty: 16 | Fill #1

## 2018-06-26 MED FILL — SIMVASTATIN 40 MG TABLET: 40 | 90 days supply | Qty: 90 | Fill #0 | Status: TO

## 2018-06-26 MED FILL — EZETIMIBE 10 MG TABS: 10 | 90 days supply | Qty: 90 | Fill #0 | Status: TO

## 2018-06-26 MED FILL — FLUTICASONE PROP 50 MCG SPR: 50 | 30 days supply | Qty: 16 | Fill #0 | Status: TO

## 2018-07-03 MED FILL — ESOMEPRAZOLE MAG DR 40 MG C: 40 | 90 days supply | Qty: 90 | Fill #0 | Status: TO

## 2018-07-05 DIAGNOSIS — M7541 Impingement syndrome of right shoulder: Secondary | ICD-10-CM | POA: Diagnosis not present

## 2018-07-05 DIAGNOSIS — M25511 Pain in right shoulder: Secondary | ICD-10-CM | POA: Diagnosis not present

## 2018-07-31 DIAGNOSIS — M7541 Impingement syndrome of right shoulder: Secondary | ICD-10-CM | POA: Diagnosis not present

## 2018-08-29 MED FILL — FLUTICASONE PROP 50 MCG SPR: 50 | 30 days supply | Qty: 16 | Fill #0

## 2018-09-22 MED FILL — EZETIMIBE 10 MG TABS: 10 | 90 days supply | Qty: 90 | Fill #1

## 2018-09-22 MED FILL — ESOMEPRAZOLE MAG DR 40 MG C: 40 | 90 days supply | Qty: 90 | Fill #0

## 2018-10-11 DIAGNOSIS — R7301 Impaired fasting glucose: Secondary | ICD-10-CM | POA: Diagnosis not present

## 2018-10-11 DIAGNOSIS — E782 Mixed hyperlipidemia: Secondary | ICD-10-CM | POA: Diagnosis not present

## 2018-10-18 DIAGNOSIS — Z0001 Encounter for general adult medical examination with abnormal findings: Secondary | ICD-10-CM | POA: Diagnosis not present

## 2018-10-18 DIAGNOSIS — L720 Epidermal cyst: Secondary | ICD-10-CM | POA: Diagnosis not present

## 2018-10-18 DIAGNOSIS — K219 Gastro-esophageal reflux disease without esophagitis: Secondary | ICD-10-CM | POA: Diagnosis not present

## 2018-10-18 DIAGNOSIS — E782 Mixed hyperlipidemia: Secondary | ICD-10-CM | POA: Diagnosis not present

## 2018-10-18 DIAGNOSIS — J302 Other seasonal allergic rhinitis: Secondary | ICD-10-CM | POA: Diagnosis not present

## 2018-10-18 DIAGNOSIS — M25511 Pain in right shoulder: Secondary | ICD-10-CM | POA: Diagnosis not present

## 2018-10-18 DIAGNOSIS — R7303 Prediabetes: Secondary | ICD-10-CM | POA: Diagnosis not present

## 2018-12-01 MED FILL — FLUTICASONE PROP 50 MCG SPR: 50 | 30 days supply | Qty: 16 | Fill #1

## 2018-12-08 MED FILL — SIMVASTATIN 40 MG TABLET: 40 | 90 days supply | Qty: 90 | Fill #0

## 2018-12-15 MED FILL — ESOMEPRAZOLE MAG DR 40 MG C: 40 | 90 days supply | Qty: 90 | Fill #1

## 2019-01-11 MED FILL — EZETIMIBE 10 MG TABS: 10 | 90 days supply | Qty: 90 | Fill #0

## 2019-01-11 MED FILL — FLUTICASONE PROP 50 MCG SPR: 50 | 30 days supply | Qty: 16 | Fill #2

## 2019-03-19 DIAGNOSIS — M7712 Lateral epicondylitis, left elbow: Secondary | ICD-10-CM | POA: Diagnosis not present

## 2019-03-19 MED FILL — predniSONE 10 MG TABS: 10 | 12 days supply | Qty: 21 | Fill #0

## 2019-03-20 MED FILL — ESOMEPRAZOLE MAG DR 40 MG C: 40 | 90 days supply | Qty: 90 | Fill #0

## 2019-03-21 MED FILL — SIMVASTATIN 40 MG TABLET: 40 | 90 days supply | Qty: 90 | Fill #0

## 2019-03-21 MED FILL — FLUTICASONE PROP 50 MCG SPR: 50 | 30 days supply | Qty: 16 | Fill #0

## 2019-05-10 DIAGNOSIS — H52223 Regular astigmatism, bilateral: Secondary | ICD-10-CM | POA: Diagnosis not present

## 2019-05-10 DIAGNOSIS — H5203 Hypermetropia, bilateral: Secondary | ICD-10-CM | POA: Diagnosis not present

## 2019-05-10 DIAGNOSIS — H2513 Age-related nuclear cataract, bilateral: Secondary | ICD-10-CM | POA: Diagnosis not present

## 2019-05-10 DIAGNOSIS — H524 Presbyopia: Secondary | ICD-10-CM | POA: Diagnosis not present

## 2019-06-21 MED FILL — EZETIMIBE 10 MG TABS: 10 | 90 days supply | Qty: 90 | Fill #0

## 2019-06-21 MED FILL — ESOMEPRAZOLE MAG DR 40 MG C: 40 | 90 days supply | Qty: 90 | Fill #0

## 2019-06-21 MED FILL — SIMVASTATIN 40 MG TABLET: 40 | 90 days supply | Qty: 90 | Fill #0

## 2019-07-12 DIAGNOSIS — Z125 Encounter for screening for malignant neoplasm of prostate: Secondary | ICD-10-CM | POA: Diagnosis not present

## 2019-07-12 DIAGNOSIS — R7301 Impaired fasting glucose: Secondary | ICD-10-CM | POA: Diagnosis not present

## 2019-07-12 DIAGNOSIS — E782 Mixed hyperlipidemia: Secondary | ICD-10-CM | POA: Diagnosis not present

## 2019-07-12 DIAGNOSIS — R7303 Prediabetes: Secondary | ICD-10-CM | POA: Diagnosis not present

## 2019-07-17 DIAGNOSIS — K219 Gastro-esophageal reflux disease without esophagitis: Secondary | ICD-10-CM | POA: Diagnosis not present

## 2019-07-17 DIAGNOSIS — R7303 Prediabetes: Secondary | ICD-10-CM | POA: Diagnosis not present

## 2019-07-17 DIAGNOSIS — D649 Anemia, unspecified: Secondary | ICD-10-CM | POA: Diagnosis not present

## 2019-07-17 DIAGNOSIS — E782 Mixed hyperlipidemia: Secondary | ICD-10-CM | POA: Diagnosis not present

## 2019-07-17 DIAGNOSIS — J302 Other seasonal allergic rhinitis: Secondary | ICD-10-CM | POA: Diagnosis not present

## 2019-09-20 MED FILL — SIMVASTATIN 40 MG TABLET: 40 | 90 days supply | Qty: 90 | Fill #0

## 2019-09-20 MED FILL — EZETIMIBE 10 MG TABS: 10 | 90 days supply | Qty: 90 | Fill #0

## 2019-09-20 MED FILL — ESOMEPRAZOLE MAG DR 40 MG C: 40 | 90 days supply | Qty: 90 | Fill #0

## 2019-09-26 DIAGNOSIS — L570 Actinic keratosis: Secondary | ICD-10-CM | POA: Diagnosis not present

## 2019-09-26 DIAGNOSIS — X32XXXD Exposure to sunlight, subsequent encounter: Secondary | ICD-10-CM | POA: Diagnosis not present

## 2019-09-26 DIAGNOSIS — B078 Other viral warts: Secondary | ICD-10-CM | POA: Diagnosis not present

## 2019-09-26 DIAGNOSIS — L700 Acne vulgaris: Secondary | ICD-10-CM | POA: Diagnosis not present

## 2019-12-14 MED FILL — EZETIMIBE 10 MG TABS: 10 | 90 days supply | Qty: 90 | Fill #0

## 2019-12-17 MED FILL — ESOMEPRAZOLE MAG DR 40 MG C: 40 | 90 days supply | Qty: 90 | Fill #0

## 2019-12-17 MED FILL — SIMVASTATIN 40 MG TABLET: 40 | 90 days supply | Qty: 90 | Fill #0

## 2019-12-31 MED FILL — FLUTICASONE PROP 50 MCG SPR: 50 | 30 days supply | Qty: 16 | Fill #1

## 2020-01-24 DIAGNOSIS — E782 Mixed hyperlipidemia: Secondary | ICD-10-CM | POA: Diagnosis not present

## 2020-01-24 DIAGNOSIS — R7301 Impaired fasting glucose: Secondary | ICD-10-CM | POA: Diagnosis not present

## 2020-01-24 DIAGNOSIS — Z712 Person consulting for explanation of examination or test findings: Secondary | ICD-10-CM | POA: Diagnosis not present

## 2020-01-24 DIAGNOSIS — J302 Other seasonal allergic rhinitis: Secondary | ICD-10-CM | POA: Diagnosis not present

## 2020-01-24 DIAGNOSIS — Z7689 Persons encountering health services in other specified circumstances: Secondary | ICD-10-CM | POA: Diagnosis not present

## 2020-01-24 DIAGNOSIS — S43439A Superior glenoid labrum lesion of unspecified shoulder, initial encounter: Secondary | ICD-10-CM | POA: Diagnosis not present

## 2020-01-24 DIAGNOSIS — Z0000 Encounter for general adult medical examination without abnormal findings: Secondary | ICD-10-CM | POA: Diagnosis not present

## 2020-01-24 DIAGNOSIS — Z6827 Body mass index (BMI) 27.0-27.9, adult: Secondary | ICD-10-CM | POA: Diagnosis not present

## 2020-01-24 DIAGNOSIS — Z1389 Encounter for screening for other disorder: Secondary | ICD-10-CM | POA: Diagnosis not present

## 2020-01-24 DIAGNOSIS — D649 Anemia, unspecified: Secondary | ICD-10-CM | POA: Diagnosis not present

## 2020-01-29 DIAGNOSIS — E663 Overweight: Secondary | ICD-10-CM | POA: Diagnosis not present

## 2020-01-29 DIAGNOSIS — E782 Mixed hyperlipidemia: Secondary | ICD-10-CM | POA: Diagnosis not present

## 2020-01-29 DIAGNOSIS — M1380 Other specified arthritis, unspecified site: Secondary | ICD-10-CM | POA: Diagnosis not present

## 2020-01-29 DIAGNOSIS — K219 Gastro-esophageal reflux disease without esophagitis: Secondary | ICD-10-CM | POA: Diagnosis not present

## 2020-01-29 DIAGNOSIS — J302 Other seasonal allergic rhinitis: Secondary | ICD-10-CM | POA: Diagnosis not present

## 2020-01-29 DIAGNOSIS — R7303 Prediabetes: Secondary | ICD-10-CM | POA: Diagnosis not present

## 2020-01-29 DIAGNOSIS — D649 Anemia, unspecified: Secondary | ICD-10-CM | POA: Diagnosis not present

## 2020-01-29 DIAGNOSIS — Z6826 Body mass index (BMI) 26.0-26.9, adult: Secondary | ICD-10-CM | POA: Diagnosis not present

## 2020-01-29 DIAGNOSIS — Z0001 Encounter for general adult medical examination with abnormal findings: Secondary | ICD-10-CM | POA: Diagnosis not present

## 2020-02-27 ENCOUNTER — Other Ambulatory Visit (HOSPITAL_COMMUNITY): Payer: Self-pay | Admitting: Internal Medicine

## 2020-02-27 MED FILL — FLUTICASONE PROP 50 MCG SPR: 50 | 30 days supply | Qty: 16 | Fill #2

## 2020-02-29 MED FILL — EZETIMIBE 10 MG TABS: 10 | 90 days supply | Qty: 90 | Fill #0

## 2020-02-29 MED FILL — ESOMEPRAZOLE MAG DR 40 MG C: 40 | 90 days supply | Qty: 90 | Fill #0

## 2020-02-29 MED FILL — SIMVASTATIN 40 MG TABLET: 40 | 90 days supply | Qty: 90 | Fill #0

## 2020-04-03 ENCOUNTER — Ambulatory Visit: Payer: 59 | Attending: Internal Medicine

## 2020-04-03 DIAGNOSIS — Z23 Encounter for immunization: Secondary | ICD-10-CM

## 2020-04-03 NOTE — Progress Notes (Signed)
   Covid-19 Vaccination Clinic  Name:  Nathaniel Mahoney    MRN: [id number] DOB: [birth year]  04/03/2020  Nathaniel Mahoney was observed post Covid-19 immunization for 15 minutes without incident. He was provided with Vaccine Information Sheet and instruction to access the V-Safe system.   Nathaniel Mahoney was instructed to call 911 with any severe reactions post vaccine: Marland Kitchen Difficulty breathing  . Swelling of face and throat  . A fast heartbeat  . A bad rash all over body  . Dizziness and weakness   Immunizations Administered    Name Date Dose VIS Date Route   Moderna Covid-19 Booster Vaccine 04/03/2020  3:53 PM 0.25 mL 01/23/2020 Intramuscular   Manufacturer: Moderna   Lot: [id number]   NDC: 76160-737-10

## 2020-05-14 DIAGNOSIS — H52223 Regular astigmatism, bilateral: Secondary | ICD-10-CM | POA: Diagnosis not present

## 2020-05-14 DIAGNOSIS — H5203 Hypermetropia, bilateral: Secondary | ICD-10-CM | POA: Diagnosis not present

## 2020-05-14 DIAGNOSIS — H524 Presbyopia: Secondary | ICD-10-CM | POA: Diagnosis not present

## 2020-05-14 DIAGNOSIS — H2513 Age-related nuclear cataract, bilateral: Secondary | ICD-10-CM | POA: Diagnosis not present

## 2020-05-16 ENCOUNTER — Other Ambulatory Visit (HOSPITAL_COMMUNITY): Payer: Self-pay | Admitting: Internal Medicine

## 2020-05-26 MED FILL — SIMVASTATIN 40 MG TABLET: 40 | 90 days supply | Qty: 90 | Fill #1

## 2020-05-26 MED FILL — EZETIMIBE 10 MG TABS: 10 | 90 days supply | Qty: 90 | Fill #1

## 2020-05-26 MED FILL — ESOMEPRAZOLE MAG DR 40 MG C: 40 | 90 days supply | Qty: 90 | Fill #1

## 2020-06-18 ENCOUNTER — Other Ambulatory Visit: Payer: Self-pay | Admitting: Family

## 2020-06-18 ENCOUNTER — Telehealth: Payer: 59 | Admitting: Family

## 2020-06-18 DIAGNOSIS — J209 Acute bronchitis, unspecified: Secondary | ICD-10-CM

## 2020-06-18 MED ORDER — PREDNISONE 10 MG (21) PO TBPK: ORAL_TABLET | ORAL | 0 refills | Status: DC

## 2020-06-18 MED ORDER — BENZONATATE 100 MG PO CAPS: 100.0000 mg | ORAL_CAPSULE | Freq: Three times a day (TID) | ORAL | 0 refills | Status: DC | PRN

## 2020-06-18 NOTE — Progress Notes (Signed)

## 2020-07-28 DIAGNOSIS — E782 Mixed hyperlipidemia: Secondary | ICD-10-CM | POA: Diagnosis not present

## 2020-07-28 DIAGNOSIS — R7301 Impaired fasting glucose: Secondary | ICD-10-CM | POA: Diagnosis not present

## 2020-07-28 DIAGNOSIS — D649 Anemia, unspecified: Secondary | ICD-10-CM | POA: Diagnosis not present

## 2020-07-28 DIAGNOSIS — Z0001 Encounter for general adult medical examination with abnormal findings: Secondary | ICD-10-CM | POA: Diagnosis not present

## 2020-07-28 DIAGNOSIS — R7303 Prediabetes: Secondary | ICD-10-CM | POA: Diagnosis not present

## 2020-07-31 ENCOUNTER — Other Ambulatory Visit (HOSPITAL_COMMUNITY): Payer: Self-pay

## 2020-07-31 DIAGNOSIS — E782 Mixed hyperlipidemia: Secondary | ICD-10-CM | POA: Diagnosis not present

## 2020-07-31 DIAGNOSIS — M1380 Other specified arthritis, unspecified site: Secondary | ICD-10-CM | POA: Diagnosis not present

## 2020-07-31 DIAGNOSIS — E663 Overweight: Secondary | ICD-10-CM | POA: Diagnosis not present

## 2020-07-31 DIAGNOSIS — Z6826 Body mass index (BMI) 26.0-26.9, adult: Secondary | ICD-10-CM | POA: Diagnosis not present

## 2020-07-31 DIAGNOSIS — J302 Other seasonal allergic rhinitis: Secondary | ICD-10-CM | POA: Diagnosis not present

## 2020-07-31 DIAGNOSIS — R7303 Prediabetes: Secondary | ICD-10-CM | POA: Diagnosis not present

## 2020-07-31 DIAGNOSIS — K219 Gastro-esophageal reflux disease without esophagitis: Secondary | ICD-10-CM | POA: Diagnosis not present

## 2020-07-31 DIAGNOSIS — D649 Anemia, unspecified: Secondary | ICD-10-CM | POA: Diagnosis not present

## 2020-07-31 MED ORDER — SIMVASTATIN 40 MG PO TABS
40.0000 mg | ORAL_TABLET | Freq: Every day | ORAL | 3 refills | Status: DC
  Filled 2020-07-31 – 2020-08-25 (×2): qty 90, 90d supply, fill #0
  Filled 2021-02-18: qty 90, 90d supply, fill #1
  Filled 2021-05-04: qty 90, 90d supply, fill #2

## 2020-07-31 MED ORDER — EZETIMIBE 10 MG PO TABS
10.0000 mg | ORAL_TABLET | Freq: Every day | ORAL | 3 refills | Status: DC
  Filled 2020-07-31 – 2020-08-25 (×2): qty 90, 90d supply, fill #0
  Filled 2021-02-18: qty 90, 90d supply, fill #1
  Filled 2021-05-04: qty 90, 90d supply, fill #2

## 2020-07-31 MED ORDER — ESOMEPRAZOLE MAGNESIUM 40 MG PO CPDR
DELAYED_RELEASE_CAPSULE | ORAL | 3 refills | Status: DC
  Filled 2020-07-31 – 2020-08-25 (×2): qty 90, 90d supply, fill #0
  Filled 2020-11-27: qty 90, 90d supply, fill #1

## 2020-07-31 MED ORDER — AZELASTINE-FLUTICASONE 137-50 MCG/ACT NA SUSP
NASAL | 3 refills | Status: DC
  Filled 2020-07-31: qty 23, 30d supply, fill #0
  Filled 2020-07-31 – 2020-08-25 (×3): qty 69, 90d supply, fill #0

## 2020-08-25 ENCOUNTER — Other Ambulatory Visit (HOSPITAL_COMMUNITY): Payer: Self-pay

## 2020-08-27 ENCOUNTER — Other Ambulatory Visit (HOSPITAL_COMMUNITY): Payer: Self-pay

## 2020-08-29 ENCOUNTER — Other Ambulatory Visit (HOSPITAL_COMMUNITY): Payer: Self-pay

## 2020-08-29 MED ORDER — AZELASTINE-FLUTICASONE 137-50 MCG/ACT NA SUSP
NASAL | 3 refills | Status: DC
  Filled 2020-08-29: qty 69, 90d supply, fill #0

## 2020-09-12 ENCOUNTER — Other Ambulatory Visit (HOSPITAL_COMMUNITY): Payer: Self-pay

## 2020-09-16 ENCOUNTER — Other Ambulatory Visit (HOSPITAL_COMMUNITY): Payer: Self-pay

## 2020-09-17 ENCOUNTER — Other Ambulatory Visit (HOSPITAL_COMMUNITY): Payer: Self-pay

## 2020-09-22 ENCOUNTER — Other Ambulatory Visit (HOSPITAL_COMMUNITY): Payer: Self-pay

## 2020-09-22 MED ORDER — AZELASTINE HCL 0.1 % NA SOLN
2.0000 | Freq: Two times a day (BID) | NASAL | 3 refills | Status: DC
  Filled 2020-09-22: qty 90, 75d supply, fill #0
  Filled 2021-02-18: qty 30, 25d supply, fill #1

## 2020-11-27 ENCOUNTER — Other Ambulatory Visit (HOSPITAL_COMMUNITY): Payer: Self-pay

## 2020-12-25 ENCOUNTER — Other Ambulatory Visit (HOSPITAL_COMMUNITY): Payer: Self-pay

## 2020-12-25 DIAGNOSIS — Z87891 Personal history of nicotine dependence: Secondary | ICD-10-CM | POA: Diagnosis not present

## 2020-12-25 DIAGNOSIS — J343 Hypertrophy of nasal turbinates: Secondary | ICD-10-CM | POA: Diagnosis not present

## 2020-12-25 DIAGNOSIS — K219 Gastro-esophageal reflux disease without esophagitis: Secondary | ICD-10-CM | POA: Diagnosis not present

## 2020-12-25 DIAGNOSIS — R0982 Postnasal drip: Secondary | ICD-10-CM | POA: Diagnosis not present

## 2020-12-25 MED ORDER — ESOMEPRAZOLE MAGNESIUM 40 MG PO CPDR
40.0000 mg | DELAYED_RELEASE_CAPSULE | Freq: Two times a day (BID) | ORAL | 5 refills | Status: DC
  Filled 2020-12-25: qty 60, 30d supply, fill #0
  Filled 2021-01-28: qty 180, 90d supply, fill #0
  Filled 2021-04-29: qty 180, 90d supply, fill #1

## 2021-01-05 ENCOUNTER — Other Ambulatory Visit (HOSPITAL_COMMUNITY): Payer: Self-pay

## 2021-01-28 ENCOUNTER — Other Ambulatory Visit (HOSPITAL_COMMUNITY): Payer: Self-pay

## 2021-02-03 DIAGNOSIS — E782 Mixed hyperlipidemia: Secondary | ICD-10-CM | POA: Diagnosis not present

## 2021-02-03 DIAGNOSIS — R7303 Prediabetes: Secondary | ICD-10-CM | POA: Diagnosis not present

## 2021-02-05 DIAGNOSIS — Z8616 Personal history of COVID-19: Secondary | ICD-10-CM | POA: Diagnosis not present

## 2021-02-05 DIAGNOSIS — D649 Anemia, unspecified: Secondary | ICD-10-CM | POA: Diagnosis not present

## 2021-02-05 DIAGNOSIS — J302 Other seasonal allergic rhinitis: Secondary | ICD-10-CM | POA: Diagnosis not present

## 2021-02-05 DIAGNOSIS — R7303 Prediabetes: Secondary | ICD-10-CM | POA: Diagnosis not present

## 2021-02-05 DIAGNOSIS — E663 Overweight: Secondary | ICD-10-CM | POA: Diagnosis not present

## 2021-02-05 DIAGNOSIS — E782 Mixed hyperlipidemia: Secondary | ICD-10-CM | POA: Diagnosis not present

## 2021-02-05 DIAGNOSIS — M1380 Other specified arthritis, unspecified site: Secondary | ICD-10-CM | POA: Diagnosis not present

## 2021-02-05 DIAGNOSIS — Z0001 Encounter for general adult medical examination with abnormal findings: Secondary | ICD-10-CM | POA: Diagnosis not present

## 2021-02-05 DIAGNOSIS — K219 Gastro-esophageal reflux disease without esophagitis: Secondary | ICD-10-CM | POA: Diagnosis not present

## 2021-02-13 ENCOUNTER — Other Ambulatory Visit (HOSPITAL_COMMUNITY): Payer: Self-pay

## 2021-02-18 ENCOUNTER — Other Ambulatory Visit (HOSPITAL_COMMUNITY): Payer: Self-pay

## 2021-04-03 ENCOUNTER — Other Ambulatory Visit (HOSPITAL_COMMUNITY): Payer: Self-pay

## 2021-04-03 MED ORDER — CARESTART COVID-19 HOME TEST VI KIT
PACK | 0 refills | Status: AC
  Filled 2021-04-03: qty 4, 4d supply, fill #0

## 2021-04-29 ENCOUNTER — Other Ambulatory Visit (HOSPITAL_COMMUNITY): Payer: Self-pay

## 2021-04-29 MED ORDER — AZELASTINE HCL 0.1 % NA SOLN
2.0000 | Freq: Two times a day (BID) | NASAL | 3 refills | Status: DC
  Filled 2021-04-29: qty 90, 75d supply, fill #0
  Filled 2021-04-29: qty 30, 25d supply, fill #0

## 2021-05-04 ENCOUNTER — Other Ambulatory Visit (HOSPITAL_COMMUNITY): Payer: Self-pay

## 2021-05-05 ENCOUNTER — Telehealth: Payer: 59 | Admitting: Physician Assistant

## 2021-05-05 DIAGNOSIS — B9689 Other specified bacterial agents as the cause of diseases classified elsewhere: Secondary | ICD-10-CM | POA: Diagnosis not present

## 2021-05-05 DIAGNOSIS — J208 Acute bronchitis due to other specified organisms: Secondary | ICD-10-CM | POA: Diagnosis not present

## 2021-05-05 MED ORDER — AZITHROMYCIN 250 MG PO TABS: ORAL_TABLET | ORAL | 0 refills | Status: AC

## 2021-05-05 NOTE — Progress Notes (Signed)
I have spent 5 minutes in review of e-visit questionnaire, review and updating patient chart, medical decision making and response to patient.   Pacen Watford Cody Chrisandra Wiemers, PA-C    

## 2021-05-05 NOTE — Progress Notes (Signed)
We are sorry that you are not feeling well.  Here is how we plan to help!  Based on your presentation I believe you most likely have A cough due to bacteria.  When patients have a fever and a productive cough with a change in color or increased sputum production, we are concerned about bacterial bronchitis.  If left untreated it can progress to pneumonia.  If your symptoms do not improve with your treatment plan it is important that you contact your provider.   I have prescribed Azithromyin 250 mg: two tablets now and then one tablet daily for 4 additonal days    In addition you may use A non-prescription cough medication called Robitussin DAC. Take 2 teaspoons every 8 hours or Delsym: take 2 teaspoons every 12 hours.   From your responses in the eVisit questionnaire you describe inflammation in the upper respiratory tract which is causing a significant cough.  This is commonly called Bronchitis and has four common causes:   Allergies Viral Infections Acid Reflux Bacterial Infection Allergies, viruses and acid reflux are treated by controlling symptoms or eliminating the cause. An example might be a cough caused by taking certain blood pressure medications. You stop the cough by changing the medication. Another example might be a cough caused by acid reflux. Controlling the reflux helps control the cough.  USE OF BRONCHODILATOR ("RESCUE") INHALERS: There is a risk from using your bronchodilator too frequently.  The risk is that over-reliance on a medication which only relaxes the muscles surrounding the breathing tubes can reduce the effectiveness of medications prescribed to reduce swelling and congestion of the tubes themselves.  Although you feel brief relief from the bronchodilator inhaler, your asthma may actually be worsening with the tubes becoming more swollen and filled with mucus.  This can delay other crucial treatments, such as oral steroid medications. If you need to use a bronchodilator  inhaler daily, several times per day, you should discuss this with your provider.  There are probably better treatments that could be used to keep your asthma under control.     HOME CARE Only take medications as instructed by your medical team. Complete the entire course of an antibiotic. Drink plenty of fluids and get plenty of rest. Avoid close contacts especially the very young and the elderly Cover your mouth if you cough or cough into your sleeve. Always remember to wash your hands A steam or ultrasonic humidifier can help congestion.   GET HELP RIGHT AWAY IF: You develop worsening fever. You become short of breath You cough up blood. Your symptoms persist after you have completed your treatment plan MAKE SURE YOU  Understand these instructions. Will watch your condition. Will get help right away if you are not doing well or get worse.    Thank you for choosing an e-visit.  Your e-visit answers were reviewed by a board certified advanced clinical practitioner to complete your personal care plan. Depending upon the condition, your plan could have included both over the counter or prescription medications.  Please review your pharmacy choice. Make sure the pharmacy is open so you can pick up prescription now. If there is a problem, you may contact your provider through [employer] and have the prescription routed to another pharmacy.  Your safety is important to [place]. If you have drug allergies check your prescription carefully.   For the next 24 hours you can use MyChart to ask questions about today's visit, request a non-urgent call back, or ask  for a work or school excuse. You will get an email in the next two days asking about your experience. I hope that your e-visit has been valuable and will speed your recovery.

## 2021-05-18 DIAGNOSIS — H2513 Age-related nuclear cataract, bilateral: Secondary | ICD-10-CM | POA: Diagnosis not present

## 2021-05-18 DIAGNOSIS — H524 Presbyopia: Secondary | ICD-10-CM | POA: Diagnosis not present

## 2021-05-18 DIAGNOSIS — H52223 Regular astigmatism, bilateral: Secondary | ICD-10-CM | POA: Diagnosis not present

## 2021-05-18 DIAGNOSIS — H5203 Hypermetropia, bilateral: Secondary | ICD-10-CM | POA: Diagnosis not present

## 2021-06-05 ENCOUNTER — Other Ambulatory Visit (HOSPITAL_COMMUNITY): Payer: Self-pay

## 2021-06-05 ENCOUNTER — Telehealth: Payer: 59 | Admitting: Physician Assistant

## 2021-06-05 DIAGNOSIS — B9689 Other specified bacterial agents as the cause of diseases classified elsewhere: Secondary | ICD-10-CM | POA: Diagnosis not present

## 2021-06-05 DIAGNOSIS — J019 Acute sinusitis, unspecified: Secondary | ICD-10-CM

## 2021-06-05 MED ORDER — AMOXICILLIN-POT CLAVULANATE 875-125 MG PO TABS
1.0000 | ORAL_TABLET | Freq: Two times a day (BID) | ORAL | 0 refills | Status: DC
  Filled 2021-06-05: qty 14, 7d supply, fill #0

## 2021-06-05 NOTE — Progress Notes (Signed)

## 2021-06-05 NOTE — Progress Notes (Signed)
I have spent 5 minutes in review of e-visit questionnaire, review and updating patient chart, medical decision making and response to patient.   Randie Bloodgood Cody Delaney Perona, PA-C    

## 2021-08-03 DIAGNOSIS — R7303 Prediabetes: Secondary | ICD-10-CM | POA: Diagnosis not present

## 2021-08-06 ENCOUNTER — Other Ambulatory Visit (HOSPITAL_COMMUNITY): Payer: Self-pay

## 2021-08-06 DIAGNOSIS — D649 Anemia, unspecified: Secondary | ICD-10-CM | POA: Diagnosis not present

## 2021-08-06 DIAGNOSIS — M1380 Other specified arthritis, unspecified site: Secondary | ICD-10-CM | POA: Diagnosis not present

## 2021-08-06 DIAGNOSIS — E782 Mixed hyperlipidemia: Secondary | ICD-10-CM | POA: Diagnosis not present

## 2021-08-06 DIAGNOSIS — R7303 Prediabetes: Secondary | ICD-10-CM | POA: Diagnosis not present

## 2021-08-06 DIAGNOSIS — J302 Other seasonal allergic rhinitis: Secondary | ICD-10-CM | POA: Diagnosis not present

## 2021-08-06 DIAGNOSIS — M19041 Primary osteoarthritis, right hand: Secondary | ICD-10-CM | POA: Diagnosis not present

## 2021-08-06 DIAGNOSIS — E663 Overweight: Secondary | ICD-10-CM | POA: Diagnosis not present

## 2021-08-06 DIAGNOSIS — Z8616 Personal history of COVID-19: Secondary | ICD-10-CM | POA: Diagnosis not present

## 2021-08-06 DIAGNOSIS — K219 Gastro-esophageal reflux disease without esophagitis: Secondary | ICD-10-CM | POA: Diagnosis not present

## 2021-08-06 MED ORDER — EZETIMIBE 10 MG PO TABS
10.0000 mg | ORAL_TABLET | Freq: Every day | ORAL | 0 refills | Status: DC
  Filled 2021-08-06: qty 90, 90d supply, fill #0

## 2021-08-06 MED ORDER — ESOMEPRAZOLE MAGNESIUM 40 MG PO CPDR
40.0000 mg | DELAYED_RELEASE_CAPSULE | Freq: Two times a day (BID) | ORAL | 0 refills | Status: DC
  Filled 2021-08-06: qty 180, 90d supply, fill #0

## 2021-08-06 MED ORDER — SIMVASTATIN 40 MG PO TABS
40.0000 mg | ORAL_TABLET | Freq: Every day | ORAL | 0 refills | Status: DC
  Filled 2021-08-06: qty 90, 90d supply, fill #0

## 2021-08-07 ENCOUNTER — Other Ambulatory Visit (HOSPITAL_COMMUNITY): Payer: Self-pay

## 2021-11-09 DIAGNOSIS — M25511 Pain in right shoulder: Secondary | ICD-10-CM | POA: Diagnosis not present

## 2021-11-09 DIAGNOSIS — G5601 Carpal tunnel syndrome, right upper limb: Secondary | ICD-10-CM | POA: Diagnosis not present

## 2021-11-09 DIAGNOSIS — M542 Cervicalgia: Secondary | ICD-10-CM | POA: Diagnosis not present

## 2021-11-09 DIAGNOSIS — M7541 Impingement syndrome of right shoulder: Secondary | ICD-10-CM | POA: Diagnosis not present

## 2021-11-11 ENCOUNTER — Other Ambulatory Visit (HOSPITAL_COMMUNITY): Payer: Self-pay

## 2021-11-11 MED ORDER — SIMVASTATIN 40 MG PO TABS
40.0000 mg | ORAL_TABLET | Freq: Every day | ORAL | 0 refills | Status: DC
  Filled 2021-11-11: qty 90, 90d supply, fill #0

## 2021-11-11 MED ORDER — ESOMEPRAZOLE MAGNESIUM 40 MG PO CPDR
40.0000 mg | DELAYED_RELEASE_CAPSULE | Freq: Two times a day (BID) | ORAL | 0 refills | Status: DC
  Filled 2021-11-11: qty 180, 90d supply, fill #0

## 2021-11-11 MED ORDER — EZETIMIBE 10 MG PO TABS
10.0000 mg | ORAL_TABLET | Freq: Every day | ORAL | 0 refills | Status: DC
  Filled 2021-11-11: qty 90, 90d supply, fill #0

## 2021-11-25 DIAGNOSIS — G5601 Carpal tunnel syndrome, right upper limb: Secondary | ICD-10-CM | POA: Diagnosis not present

## 2021-11-25 DIAGNOSIS — G5621 Lesion of ulnar nerve, right upper limb: Secondary | ICD-10-CM | POA: Diagnosis not present

## 2021-12-02 DIAGNOSIS — M79641 Pain in right hand: Secondary | ICD-10-CM | POA: Diagnosis not present

## 2021-12-02 DIAGNOSIS — G5601 Carpal tunnel syndrome, right upper limb: Secondary | ICD-10-CM | POA: Diagnosis not present

## 2021-12-02 DIAGNOSIS — M65331 Trigger finger, right middle finger: Secondary | ICD-10-CM | POA: Diagnosis not present

## 2021-12-02 DIAGNOSIS — M7541 Impingement syndrome of right shoulder: Secondary | ICD-10-CM | POA: Diagnosis not present

## 2021-12-02 DIAGNOSIS — G5621 Lesion of ulnar nerve, right upper limb: Secondary | ICD-10-CM | POA: Diagnosis not present

## 2021-12-28 ENCOUNTER — Other Ambulatory Visit (HOSPITAL_COMMUNITY): Payer: Self-pay

## 2022-01-14 DIAGNOSIS — M65331 Trigger finger, right middle finger: Secondary | ICD-10-CM | POA: Diagnosis not present

## 2022-01-14 DIAGNOSIS — M65321 Trigger finger, right index finger: Secondary | ICD-10-CM | POA: Diagnosis not present

## 2022-01-14 DIAGNOSIS — G5621 Lesion of ulnar nerve, right upper limb: Secondary | ICD-10-CM | POA: Diagnosis not present

## 2022-01-14 DIAGNOSIS — M79641 Pain in right hand: Secondary | ICD-10-CM | POA: Diagnosis not present

## 2022-01-14 DIAGNOSIS — G5601 Carpal tunnel syndrome, right upper limb: Secondary | ICD-10-CM | POA: Diagnosis not present

## 2022-01-14 DIAGNOSIS — M7541 Impingement syndrome of right shoulder: Secondary | ICD-10-CM | POA: Diagnosis not present

## 2022-02-03 DIAGNOSIS — E782 Mixed hyperlipidemia: Secondary | ICD-10-CM | POA: Diagnosis not present

## 2022-02-03 DIAGNOSIS — Z125 Encounter for screening for malignant neoplasm of prostate: Secondary | ICD-10-CM | POA: Diagnosis not present

## 2022-02-03 DIAGNOSIS — R7303 Prediabetes: Secondary | ICD-10-CM | POA: Diagnosis not present

## 2022-02-10 ENCOUNTER — Other Ambulatory Visit (HOSPITAL_COMMUNITY): Payer: Self-pay

## 2022-02-10 DIAGNOSIS — M1380 Other specified arthritis, unspecified site: Secondary | ICD-10-CM | POA: Diagnosis not present

## 2022-02-10 DIAGNOSIS — R7303 Prediabetes: Secondary | ICD-10-CM | POA: Diagnosis not present

## 2022-02-10 DIAGNOSIS — K219 Gastro-esophageal reflux disease without esophagitis: Secondary | ICD-10-CM | POA: Diagnosis not present

## 2022-02-10 DIAGNOSIS — D649 Anemia, unspecified: Secondary | ICD-10-CM | POA: Diagnosis not present

## 2022-02-10 DIAGNOSIS — E663 Overweight: Secondary | ICD-10-CM | POA: Diagnosis not present

## 2022-02-10 DIAGNOSIS — E782 Mixed hyperlipidemia: Secondary | ICD-10-CM | POA: Diagnosis not present

## 2022-02-10 DIAGNOSIS — J302 Other seasonal allergic rhinitis: Secondary | ICD-10-CM | POA: Diagnosis not present

## 2022-02-10 DIAGNOSIS — Z0001 Encounter for general adult medical examination with abnormal findings: Secondary | ICD-10-CM | POA: Diagnosis not present

## 2022-02-10 DIAGNOSIS — Z8616 Personal history of COVID-19: Secondary | ICD-10-CM | POA: Diagnosis not present

## 2022-02-10 MED ORDER — ESOMEPRAZOLE MAGNESIUM 40 MG PO CPDR
40.0000 mg | DELAYED_RELEASE_CAPSULE | Freq: Two times a day (BID) | ORAL | 1 refills | Status: DC
  Filled 2022-02-10: qty 180, 90d supply, fill #0
  Filled 2022-06-19: qty 180, 90d supply, fill #1

## 2022-02-10 MED ORDER — SIMVASTATIN 40 MG PO TABS
40.0000 mg | ORAL_TABLET | Freq: Every day | ORAL | 3 refills | Status: DC
  Filled 2022-02-10: qty 90, 90d supply, fill #0

## 2022-02-10 MED ORDER — EZETIMIBE 10 MG PO TABS
10.0000 mg | ORAL_TABLET | Freq: Every day | ORAL | 3 refills | Status: DC
  Filled 2022-02-10: qty 90, 90d supply, fill #0

## 2022-02-10 MED ORDER — AZELASTINE HCL 0.1 % NA SOLN
2.0000 | Freq: Two times a day (BID) | NASAL | 3 refills | Status: AC
  Filled 2022-02-10: qty 90, 75d supply, fill #0

## 2022-02-12 ENCOUNTER — Other Ambulatory Visit (HOSPITAL_COMMUNITY): Payer: Self-pay | Admitting: Internal Medicine

## 2022-02-12 DIAGNOSIS — E782 Mixed hyperlipidemia: Secondary | ICD-10-CM

## 2022-02-15 ENCOUNTER — Ambulatory Visit (HOSPITAL_COMMUNITY)
Admission: RE | Admit: 2022-02-15 | Discharge: 2022-02-15 | Disposition: A | Discharge status: Home / Self Care | Payer: 59 | Source: Ambulatory Visit | Attending: Internal Medicine | Admitting: Internal Medicine

## 2022-02-15 DIAGNOSIS — E782 Mixed hyperlipidemia: Secondary | ICD-10-CM | POA: Insufficient documentation

## 2022-06-01 DIAGNOSIS — H524 Presbyopia: Secondary | ICD-10-CM | POA: Diagnosis not present

## 2022-06-01 DIAGNOSIS — H52223 Regular astigmatism, bilateral: Secondary | ICD-10-CM | POA: Diagnosis not present

## 2022-06-01 DIAGNOSIS — H2513 Age-related nuclear cataract, bilateral: Secondary | ICD-10-CM | POA: Diagnosis not present

## 2022-06-01 DIAGNOSIS — H5203 Hypermetropia, bilateral: Secondary | ICD-10-CM | POA: Diagnosis not present

## 2022-06-21 ENCOUNTER — Other Ambulatory Visit (HOSPITAL_COMMUNITY): Payer: Self-pay

## 2022-08-16 ENCOUNTER — Other Ambulatory Visit (HOSPITAL_COMMUNITY): Payer: Self-pay

## 2022-08-16 ENCOUNTER — Telehealth: Payer: 59 | Admitting: Nurse Practitioner

## 2022-08-16 DIAGNOSIS — J40 Bronchitis, not specified as acute or chronic: Secondary | ICD-10-CM | POA: Diagnosis not present

## 2022-08-16 MED ORDER — DOXYCYCLINE HYCLATE 100 MG PO TABS
100.0000 mg | ORAL_TABLET | Freq: Two times a day (BID) | ORAL | 0 refills | Status: AC
  Filled 2022-08-16: qty 14, 7d supply, fill #0

## 2022-08-16 NOTE — Progress Notes (Signed)
E-Visit for Cough  We are sorry that you are not feeling well.  Here is how we plan to help!  Based on your presentation I believe you most likely have A cough due to bacteria.  When patients have a fever and a productive cough with a change in color or increased sputum production, we are concerned about bacterial bronchitis.  If left untreated it can progress to pneumonia.  If your symptoms do not improve with your treatment plan it is important that you contact your provider.   I have prescribed Doxycycline 100 mg twice a day for 7 days     In addition you may use A non-prescription cough medication called Mucinex DM: take 2 tablets every 12 hours.   From your responses in the eVisit questionnaire you describe inflammation in the upper respiratory tract which is causing a significant cough.  This is commonly called Bronchitis and has four common causes:   Allergies Viral Infections Acid Reflux Bacterial Infection Allergies, viruses and acid reflux are treated by controlling symptoms or eliminating the cause. An example might be a cough caused by taking certain blood pressure medications. You stop the cough by changing the medication. Another example might be a cough caused by acid reflux. Controlling the reflux helps control the cough.     HOME CARE Only take medications as instructed by your medical team. Complete the entire course of an antibiotic. Drink plenty of fluids and get plenty of rest. Avoid close contacts especially the very young and the elderly Cover your mouth if you cough or cough into your sleeve. Always remember to wash your hands A steam or ultrasonic humidifier can help congestion.   GET HELP RIGHT AWAY IF: You develop worsening fever. You become short of breath You cough up blood. Your symptoms persist after you have completed your treatment plan MAKE SURE YOU  Understand these instructions. Will watch your condition. Will get help right away if you are not  doing well or get worse.    Thank you for choosing an e-visit.  Your e-visit answers were reviewed by a board certified advanced clinical practitioner to complete your personal care plan. Depending upon the condition, your plan could have included both over the counter or prescription medications.  Please review your pharmacy choice. Make sure the pharmacy is open so you can pick up prescription now. If there is a problem, you may contact your provider through [employer] and have the prescription routed to another pharmacy.  Your safety is important to [place]. If you have drug allergies check your prescription carefully.   For the next 24 hours you can use MyChart to ask questions about today's visit, request a non-urgent call back, or ask for a work or school excuse. You will get an email in the next two days asking about your experience. I hope that your e-visit has been valuable and will speed your recovery.   Meds ordered this encounter  Medications   doxycycline (VIBRA-TABS) 100 MG tablet    Sig: Take 1 tablet (100 mg total) by mouth 2 (two) times daily for 7 days.    Dispense:  14 tablet    Refill:  0    I spent approximately 5 minutes reviewing the patient's history, current symptoms and coordinating their care today.

## 2022-08-31 DIAGNOSIS — E782 Mixed hyperlipidemia: Secondary | ICD-10-CM | POA: Diagnosis not present

## 2022-08-31 DIAGNOSIS — R7303 Prediabetes: Secondary | ICD-10-CM | POA: Diagnosis not present

## 2022-09-02 DIAGNOSIS — M65331 Trigger finger, right middle finger: Secondary | ICD-10-CM | POA: Diagnosis not present

## 2022-09-02 DIAGNOSIS — M65321 Trigger finger, right index finger: Secondary | ICD-10-CM | POA: Diagnosis not present

## 2022-09-02 DIAGNOSIS — G5601 Carpal tunnel syndrome, right upper limb: Secondary | ICD-10-CM | POA: Diagnosis not present

## 2022-09-03 ENCOUNTER — Other Ambulatory Visit (HOSPITAL_COMMUNITY): Payer: Self-pay

## 2022-09-03 MED ORDER — ESOMEPRAZOLE MAGNESIUM 40 MG PO CPDR
40.0000 mg | DELAYED_RELEASE_CAPSULE | Freq: Two times a day (BID) | ORAL | 1 refills | Status: AC
  Filled 2022-09-03: qty 180, 90d supply, fill #0
  Filled 2023-02-28: qty 180, 90d supply, fill #1

## 2022-09-03 MED ORDER — EZETIMIBE 10 MG PO TABS
10.0000 mg | ORAL_TABLET | Freq: Every day | ORAL | 3 refills | Status: AC
  Filled 2022-09-03: qty 90, 90d supply, fill #0
  Filled 2023-02-28: qty 90, 90d supply, fill #1

## 2022-09-03 MED ORDER — SIMVASTATIN 40 MG PO TABS
40.0000 mg | ORAL_TABLET | Freq: Every day | ORAL | 3 refills | Status: AC
  Filled 2022-09-03: qty 90, 90d supply, fill #0
  Filled 2023-02-28: qty 90, 90d supply, fill #1
  Filled 2023-05-30: qty 90, 90d supply, fill #2

## 2022-09-06 ENCOUNTER — Other Ambulatory Visit (HOSPITAL_COMMUNITY): Payer: Self-pay

## 2022-09-06 MED ORDER — MOMETASONE FUROATE 50 MCG/ACT NA SUSP
2.0000 | Freq: Every day | NASAL | 3 refills | Status: DC
  Filled 2022-09-06: qty 17, 30d supply, fill #0

## 2022-09-08 ENCOUNTER — Other Ambulatory Visit (HOSPITAL_COMMUNITY): Payer: Self-pay

## 2022-09-29 ENCOUNTER — Other Ambulatory Visit (HOSPITAL_COMMUNITY): Payer: Self-pay

## 2022-10-15 DIAGNOSIS — M659 Synovitis and tenosynovitis, unspecified: Secondary | ICD-10-CM | POA: Diagnosis not present

## 2022-10-15 DIAGNOSIS — M65321 Trigger finger, right index finger: Secondary | ICD-10-CM | POA: Diagnosis not present

## 2022-10-15 DIAGNOSIS — M65331 Trigger finger, right middle finger: Secondary | ICD-10-CM | POA: Diagnosis not present

## 2022-10-15 DIAGNOSIS — G5601 Carpal tunnel syndrome, right upper limb: Secondary | ICD-10-CM | POA: Diagnosis not present

## 2023-01-31 ENCOUNTER — Other Ambulatory Visit (HOSPITAL_COMMUNITY): Payer: Self-pay

## 2023-01-31 DIAGNOSIS — M65331 Trigger finger, right middle finger: Secondary | ICD-10-CM | POA: Diagnosis not present

## 2023-01-31 DIAGNOSIS — G5601 Carpal tunnel syndrome, right upper limb: Secondary | ICD-10-CM | POA: Diagnosis not present

## 2023-01-31 DIAGNOSIS — M65321 Trigger finger, right index finger: Secondary | ICD-10-CM | POA: Diagnosis not present

## 2023-01-31 MED ORDER — OXYCODONE HCL 5 MG PO TABS
5.0000 mg | ORAL_TABLET | ORAL | 0 refills | Status: DC | PRN
  Filled 2023-01-31: qty 42, 7d supply, fill #0

## 2023-01-31 MED ORDER — CEPHALEXIN 500 MG PO CAPS
500.0000 mg | ORAL_CAPSULE | Freq: Four times a day (QID) | ORAL | 0 refills | Status: DC
  Filled 2023-01-31: qty 28, 7d supply, fill #0

## 2023-02-03 ENCOUNTER — Other Ambulatory Visit (HOSPITAL_COMMUNITY): Payer: Self-pay

## 2023-02-09 DIAGNOSIS — B078 Other viral warts: Secondary | ICD-10-CM | POA: Diagnosis not present

## 2023-02-09 DIAGNOSIS — C44311 Basal cell carcinoma of skin of nose: Secondary | ICD-10-CM | POA: Diagnosis not present

## 2023-02-14 DIAGNOSIS — M25641 Stiffness of right hand, not elsewhere classified: Secondary | ICD-10-CM | POA: Diagnosis not present

## 2023-02-22 DIAGNOSIS — R7303 Prediabetes: Secondary | ICD-10-CM | POA: Diagnosis not present

## 2023-02-22 DIAGNOSIS — Z125 Encounter for screening for malignant neoplasm of prostate: Secondary | ICD-10-CM | POA: Diagnosis not present

## 2023-02-22 DIAGNOSIS — E782 Mixed hyperlipidemia: Secondary | ICD-10-CM | POA: Diagnosis not present

## 2023-02-28 ENCOUNTER — Other Ambulatory Visit (HOSPITAL_COMMUNITY): Payer: Self-pay

## 2023-02-28 DIAGNOSIS — M25641 Stiffness of right hand, not elsewhere classified: Secondary | ICD-10-CM | POA: Diagnosis not present

## 2023-02-28 MED ORDER — SIMVASTATIN 40 MG PO TABS
40.0000 mg | ORAL_TABLET | Freq: Every day | ORAL | 3 refills | Status: DC
  Filled 2023-02-28: qty 100, 100d supply, fill #0
  Filled 2023-08-24: qty 90, 90d supply, fill #0

## 2023-02-28 MED ORDER — ESOMEPRAZOLE MAGNESIUM 40 MG PO CPDR
40.0000 mg | DELAYED_RELEASE_CAPSULE | Freq: Two times a day (BID) | ORAL | 1 refills | Status: DC
  Filled 2023-02-28 – 2023-05-30 (×2): qty 180, 90d supply, fill #0
  Filled 2023-08-24: qty 180, 90d supply, fill #1

## 2023-02-28 MED ORDER — EZETIMIBE 10 MG PO TABS
10.0000 mg | ORAL_TABLET | Freq: Every day | ORAL | 3 refills | Status: DC
  Filled 2023-02-28: qty 100, 100d supply, fill #0
  Filled 2023-05-30: qty 90, 90d supply, fill #0
  Filled 2023-08-24: qty 90, 90d supply, fill #1

## 2023-03-23 DIAGNOSIS — Z08 Encounter for follow-up examination after completed treatment for malignant neoplasm: Secondary | ICD-10-CM | POA: Diagnosis not present

## 2023-03-23 DIAGNOSIS — L700 Acne vulgaris: Secondary | ICD-10-CM | POA: Diagnosis not present

## 2023-03-23 DIAGNOSIS — Z85828 Personal history of other malignant neoplasm of skin: Secondary | ICD-10-CM | POA: Diagnosis not present

## 2023-04-01 ENCOUNTER — Other Ambulatory Visit (HOSPITAL_COMMUNITY): Payer: Self-pay

## 2023-04-01 MED ORDER — CLINDAMYCIN PHOSPHATE 1 % EX SOLN
1.0000 | Freq: Two times a day (BID) | CUTANEOUS | 99 refills | Status: AC | PRN
  Filled 2023-04-01: qty 60, 30d supply, fill #0
  Filled 2024-02-14: qty 30, 30d supply, fill #0
  Filled 2024-02-14: qty 60, 30d supply, fill #1

## 2023-05-11 ENCOUNTER — Telehealth: Payer: 59 | Admitting: Physician Assistant

## 2023-05-11 DIAGNOSIS — R6889 Other general symptoms and signs: Secondary | ICD-10-CM | POA: Diagnosis not present

## 2023-05-11 MED ORDER — OSELTAMIVIR PHOSPHATE 75 MG PO CAPS: 75.0000 mg | ORAL_CAPSULE | Freq: Two times a day (BID) | ORAL | 0 refills | Status: AC

## 2023-05-11 NOTE — Progress Notes (Signed)

## 2023-05-11 NOTE — Progress Notes (Signed)
 I have spent 5 minutes in review of e-visit questionnaire, review and updating patient chart, medical decision making and response to patient.   Piedad Climes, PA-C

## 2023-05-30 ENCOUNTER — Other Ambulatory Visit (HOSPITAL_COMMUNITY): Payer: Self-pay

## 2023-06-14 DIAGNOSIS — H524 Presbyopia: Secondary | ICD-10-CM | POA: Diagnosis not present

## 2023-06-14 DIAGNOSIS — H5203 Hypermetropia, bilateral: Secondary | ICD-10-CM | POA: Diagnosis not present

## 2023-06-14 DIAGNOSIS — H2513 Age-related nuclear cataract, bilateral: Secondary | ICD-10-CM | POA: Diagnosis not present

## 2023-06-14 DIAGNOSIS — H52223 Regular astigmatism, bilateral: Secondary | ICD-10-CM | POA: Diagnosis not present

## 2023-08-24 ENCOUNTER — Other Ambulatory Visit: Payer: Self-pay

## 2023-08-24 ENCOUNTER — Other Ambulatory Visit (HOSPITAL_COMMUNITY): Payer: Self-pay

## 2023-09-08 DIAGNOSIS — R7303 Prediabetes: Secondary | ICD-10-CM | POA: Diagnosis not present

## 2023-09-08 DIAGNOSIS — E782 Mixed hyperlipidemia: Secondary | ICD-10-CM | POA: Diagnosis not present

## 2023-09-15 ENCOUNTER — Other Ambulatory Visit (HOSPITAL_COMMUNITY): Payer: Self-pay

## 2023-09-15 MED ORDER — ESOMEPRAZOLE MAGNESIUM 40 MG PO CPDR
40.0000 mg | DELAYED_RELEASE_CAPSULE | Freq: Two times a day (BID) | ORAL | 1 refills | Status: AC
  Filled 2023-09-15 – 2024-02-14 (×3): qty 180, 90d supply, fill #0

## 2023-09-15 MED ORDER — EZETIMIBE 10 MG PO TABS
10.0000 mg | ORAL_TABLET | Freq: Every day | ORAL | 3 refills | Status: AC
  Filled 2024-02-14 (×2): qty 90, 90d supply, fill #0

## 2023-09-15 MED ORDER — SIMVASTATIN 40 MG PO TABS
40.0000 mg | ORAL_TABLET | Freq: Every day | ORAL | 3 refills | Status: AC
  Filled 2024-02-14 (×2): qty 90, 90d supply, fill #0

## 2023-09-26 DIAGNOSIS — R195 Other fecal abnormalities: Secondary | ICD-10-CM | POA: Diagnosis not present

## 2023-09-26 DIAGNOSIS — D509 Iron deficiency anemia, unspecified: Secondary | ICD-10-CM | POA: Diagnosis not present

## 2023-09-26 DIAGNOSIS — M1990 Unspecified osteoarthritis, unspecified site: Secondary | ICD-10-CM | POA: Diagnosis not present

## 2023-09-30 ENCOUNTER — Other Ambulatory Visit (HOSPITAL_BASED_OUTPATIENT_CLINIC_OR_DEPARTMENT_OTHER): Payer: Self-pay

## 2023-09-30 ENCOUNTER — Other Ambulatory Visit (HOSPITAL_COMMUNITY): Payer: Self-pay

## 2023-09-30 MED ORDER — NA SULFATE-K SULFATE-MG SULF 17.5-3.13-1.6 GM/177ML PO SOLN
1.0000 | Freq: Every day | ORAL | 0 refills | Status: AC
  Filled 2023-09-30: qty 354, 1d supply, fill #0

## 2023-10-18 DIAGNOSIS — K635 Polyp of colon: Secondary | ICD-10-CM | POA: Diagnosis not present

## 2023-10-18 DIAGNOSIS — Z1211 Encounter for screening for malignant neoplasm of colon: Secondary | ICD-10-CM | POA: Diagnosis not present

## 2023-10-18 DIAGNOSIS — D123 Benign neoplasm of transverse colon: Secondary | ICD-10-CM | POA: Diagnosis not present

## 2023-10-18 DIAGNOSIS — K319 Disease of stomach and duodenum, unspecified: Secondary | ICD-10-CM | POA: Diagnosis not present

## 2023-10-18 DIAGNOSIS — D509 Iron deficiency anemia, unspecified: Secondary | ICD-10-CM | POA: Diagnosis not present

## 2023-10-20 DIAGNOSIS — D649 Anemia, unspecified: Secondary | ICD-10-CM | POA: Diagnosis not present

## 2024-01-25 DIAGNOSIS — D509 Iron deficiency anemia, unspecified: Secondary | ICD-10-CM | POA: Diagnosis not present

## 2024-02-14 ENCOUNTER — Other Ambulatory Visit: Payer: Self-pay

## 2024-02-14 ENCOUNTER — Other Ambulatory Visit (HOSPITAL_COMMUNITY): Payer: Self-pay

## 2024-03-12 DIAGNOSIS — Z125 Encounter for screening for malignant neoplasm of prostate: Secondary | ICD-10-CM | POA: Diagnosis not present

## 2024-03-12 DIAGNOSIS — R7303 Prediabetes: Secondary | ICD-10-CM | POA: Diagnosis not present

## 2024-03-12 DIAGNOSIS — E782 Mixed hyperlipidemia: Secondary | ICD-10-CM | POA: Diagnosis not present

## 2024-03-16 ENCOUNTER — Other Ambulatory Visit (HOSPITAL_COMMUNITY): Payer: Self-pay

## 2024-03-16 MED ORDER — ESOMEPRAZOLE MAGNESIUM 40 MG PO CPDR
40.0000 mg | DELAYED_RELEASE_CAPSULE | Freq: Two times a day (BID) | ORAL | 1 refills | Status: AC
  Filled 2024-03-16: qty 180, 90d supply, fill #0

## 2024-03-16 MED ORDER — SIMVASTATIN 40 MG PO TABS
40.0000 mg | ORAL_TABLET | Freq: Every day | ORAL | 3 refills | Status: AC
  Filled 2024-03-16: qty 100, 100d supply, fill #0

## 2024-03-16 MED ORDER — EZETIMIBE 10 MG PO TABS
10.0000 mg | ORAL_TABLET | Freq: Every day | ORAL | 3 refills | Status: AC
  Filled 2024-03-16: qty 100, 100d supply, fill #0

## 2024-05-01 ENCOUNTER — Telehealth: Admitting: Physician Assistant

## 2024-05-01 ENCOUNTER — Other Ambulatory Visit (HOSPITAL_COMMUNITY): Payer: Self-pay

## 2024-05-01 DIAGNOSIS — J019 Acute sinusitis, unspecified: Secondary | ICD-10-CM | POA: Diagnosis not present

## 2024-05-01 DIAGNOSIS — B9689 Other specified bacterial agents as the cause of diseases classified elsewhere: Secondary | ICD-10-CM

## 2024-05-01 MED ORDER — AMOXICILLIN-POT CLAVULANATE 875-125 MG PO TABS
1.0000 | ORAL_TABLET | Freq: Two times a day (BID) | ORAL | 0 refills | Status: AC
  Filled 2024-05-01: qty 14, 7d supply, fill #0

## 2024-05-01 NOTE — Progress Notes (Signed)
